# Patient Record
Sex: Female | Born: 1955 | Race: White | Hispanic: No | Smoking: Former smoker
Health system: Southern US, Community
[De-identification: ages and names within clinical notes are randomized; demographics above are authoritative.]

## PROBLEM LIST (undated history)

## (undated) DIAGNOSIS — IMO0002 Reserved for concepts with insufficient information to code with codable children: Secondary | ICD-10-CM

## (undated) DIAGNOSIS — E229 Hyperfunction of pituitary gland, unspecified: Secondary | ICD-10-CM

## (undated) DIAGNOSIS — F329 Major depressive disorder, single episode, unspecified: Secondary | ICD-10-CM

## (undated) DIAGNOSIS — F32A Depression, unspecified: Secondary | ICD-10-CM

## (undated) DIAGNOSIS — N83202 Unspecified ovarian cyst, left side: Secondary | ICD-10-CM

## (undated) DIAGNOSIS — U071 COVID-19: Secondary | ICD-10-CM

## (undated) DIAGNOSIS — R896 Abnormal cytological findings in specimens from other organs, systems and tissues: Secondary | ICD-10-CM

## (undated) HISTORY — DX: Abnormal cytological findings in specimens from other organs, systems and tissues: R89.6

## (undated) HISTORY — PX: TONSILLECTOMY AND ADENOIDECTOMY: SUR1326

## (undated) HISTORY — DX: Major depressive disorder, single episode, unspecified: F32.9

## (undated) HISTORY — PX: POLYPECTOMY: SHX149

## (undated) HISTORY — PX: NASAL SEPTUM SURGERY: SHX37

## (undated) HISTORY — DX: Hyperfunction of pituitary gland, unspecified: E22.9

## (undated) HISTORY — DX: Depression, unspecified: F32.A

## (undated) HISTORY — DX: Reserved for concepts with insufficient information to code with codable children: IMO0002

## (undated) HISTORY — DX: Unspecified ovarian cyst, left side: N83.202

---

## 2005-03-28 HISTORY — PX: FOOT SURGERY: SHX648

## 2006-03-28 DIAGNOSIS — IMO0001 Reserved for inherently not codable concepts without codable children: Secondary | ICD-10-CM

## 2006-03-28 HISTORY — DX: Reserved for inherently not codable concepts without codable children: IMO0001

## 2006-03-30 ENCOUNTER — Other Ambulatory Visit: Admission: RE | Admit: 2006-03-30 | Discharge: 2006-03-30 | Payer: Self-pay | Admitting: Gynecology

## 2007-01-24 ENCOUNTER — Other Ambulatory Visit: Admission: RE | Admit: 2007-01-24 | Discharge: 2007-01-24 | Payer: Self-pay | Admitting: Gynecology

## 2007-01-27 DIAGNOSIS — R7989 Other specified abnormal findings of blood chemistry: Secondary | ICD-10-CM

## 2007-01-27 HISTORY — DX: Other specified abnormal findings of blood chemistry: R79.89

## 2007-03-29 HISTORY — PX: ROTATOR CUFF REPAIR: SHX139

## 2007-09-20 ENCOUNTER — Ambulatory Visit (HOSPITAL_BASED_OUTPATIENT_CLINIC_OR_DEPARTMENT_OTHER): Admission: RE | Admit: 2007-09-20 | Discharge: 2007-09-21 | Payer: Self-pay | Admitting: Orthopedic Surgery

## 2009-07-26 DIAGNOSIS — IMO0002 Reserved for concepts with insufficient information to code with codable children: Secondary | ICD-10-CM

## 2009-07-26 HISTORY — DX: Reserved for concepts with insufficient information to code with codable children: IMO0002

## 2009-07-26 HISTORY — PX: COLPOSCOPY: SHX161

## 2009-08-03 ENCOUNTER — Ambulatory Visit: Payer: Self-pay | Admitting: Gynecology

## 2009-08-03 ENCOUNTER — Other Ambulatory Visit: Admission: RE | Admit: 2009-08-03 | Discharge: 2009-08-03 | Payer: Self-pay | Admitting: Gynecology

## 2009-08-13 ENCOUNTER — Ambulatory Visit: Payer: Self-pay | Admitting: Gynecology

## 2010-04-05 ENCOUNTER — Ambulatory Visit (HOSPITAL_COMMUNITY): Admit: 2010-04-05 | Payer: Self-pay | Admitting: Psychiatry

## 2010-04-13 ENCOUNTER — Encounter
Admission: RE | Admit: 2010-04-13 | Discharge: 2010-04-13 | Payer: Self-pay | Source: Home / Self Care | Attending: Pulmonary Disease | Admitting: Pulmonary Disease

## 2010-05-28 ENCOUNTER — Encounter: Payer: Self-pay | Admitting: Family Medicine

## 2010-05-28 ENCOUNTER — Ambulatory Visit (INDEPENDENT_AMBULATORY_CARE_PROVIDER_SITE_OTHER): Payer: Federal, State, Local not specified - PPO | Admitting: Family Medicine

## 2010-05-28 ENCOUNTER — Other Ambulatory Visit: Payer: Self-pay | Admitting: Family Medicine

## 2010-05-28 ENCOUNTER — Ambulatory Visit
Admission: RE | Admit: 2010-05-28 | Discharge: 2010-05-28 | Disposition: A | Discharge status: Home / Self Care | Payer: Federal, State, Local not specified - PPO | Source: Ambulatory Visit | Attending: Family Medicine | Admitting: Family Medicine

## 2010-05-28 DIAGNOSIS — F329 Major depressive disorder, single episode, unspecified: Secondary | ICD-10-CM | POA: Insufficient documentation

## 2010-05-28 DIAGNOSIS — M25519 Pain in unspecified shoulder: Secondary | ICD-10-CM

## 2010-05-28 DIAGNOSIS — M6520 Calcific tendinitis, unspecified site: Secondary | ICD-10-CM | POA: Insufficient documentation

## 2010-05-28 DIAGNOSIS — F3289 Other specified depressive episodes: Secondary | ICD-10-CM | POA: Insufficient documentation

## 2010-06-03 NOTE — Letter (Signed)
Summary: Out of Work  [facility]  [address]   [place], [place] [address]   Phone: [phone]  Fax: [phone]    May 28, 2010   Employee:  [name]    To Whom It May Concern:   For Medical reasons,  Lindsey Klein must not use her right arm/shoulder for 10 days (through 06/06/10)    If you need additional information, please feel free to contact our office.         Sincerely,    Donna Christen MD

## 2010-06-08 NOTE — Assessment & Plan Note (Signed)
Summary: PULLED MUSCLE/WB rm 3   Vital Signs:  Patient Profile:   55 Years Old Female CC:      RT shoulder pain Height:     66 inches Weight:      192.25 pounds O2 Sat:      96 % O2 treatment:    Room Air Temp:     98.5 degrees F oral Pulse rate:   84 / minute Resp:     16 per minute BP sitting:   153 / 81  (left arm) Cuff size:   regular  Pt. in pain?   yes    Location:   shoulder    Intensity:   9    Type:       sharp  Vitals Entered By: Clemens Catholic LPN (May 27, 452 9:04 AM)                   Updated Prior Medication List: LEXAPRO 20 MG TABS (ESCITALOPRAM OXALATE)  WELLBUTRIN XL 300 MG XR24H-TAB (BUPROPION HCL)  LAMICTAL 100 MG TABS (LAMOTRIGINE)  ADVAIR DISKUS 100-50 MCG/DOSE AEPB (FLUTICASONE-SALMETEROL)   Current Allergies: ! PENICILLINHistory of Present Illness Chief Complaint: RT shoulder pain History of Present Illness:  Subjective:  Patient works as a Doctor, hospital.  Last night she developed soreness in her right shoulder that has persisted, although she recalls no distinct trauma.  REVIEW OF SYSTEMS Constitutional Symptoms      Denies fever, chills, night sweats, weight loss, weight gain, and fatigue.  Eyes       Denies change in vision, eye pain, eye discharge, glasses, contact lenses, and eye surgery. Ear/Nose/Throat/Mouth       Denies hearing loss/aids, change in hearing, ear pain, ear discharge, dizziness, frequent runny nose, frequent nose bleeds, sinus problems, sore throat, hoarseness, and tooth pain or bleeding.  Respiratory       Denies dry cough, productive cough, wheezing, shortness of breath, asthma, bronchitis, and emphysema/COPD.  Cardiovascular       Denies murmurs, chest pain, and tires easily with exhertion.    Gastrointestinal       Denies stomach pain, nausea/vomiting, diarrhea, constipation, blood in bowel movements, and indigestion. Genitourniary       Denies painful urination, kidney stones, and loss of urinary  control. Neurological       Denies paralysis, seizures, and fainting/blackouts. Musculoskeletal       Complains of muscle pain and joint pain.      Denies joint stiffness, decreased range of motion, redness, swelling, muscle weakness, and gout.  Skin       Denies bruising, unusual mles/lumps or sores, and hair/skin or nail changes.  Psych       Denies mood changes, temper/anger issues, anxiety/stress, speech problems, depression, and sleep problems. Other Comments: pt c/o RT shoulder/ deltoid muscle pain x last night. no injury, no OTC meds. she states it hurts worse with movement.   Past History:  Past Medical History: Depression  Past Surgical History: Caesarean section Rotator cuff repair Tonsillectomy  Family History: mother breast father lung  Social History: Never Smoked Alcohol use-yes 2-3 drinks per wk Drug use-no Smoking Status:  never Drug Use:  no   Objective:  No acute distress  Right shoulder:  Has difficulty abducting above horizontal.  Decreased external rotation.  Has difficulty reaching behind to touch bra strap.  Tenderness over distal acromion bursa.  Distal neurovascular intact  X-ray right shoulder:   IMPRESSION: AC joint osteoarthritis changes.  Calcification adjacent  greater tuberosity may reflect calcific tendonitis and / or calcific bursitis.  No fracture or bony destruction. Assessment New Problems: CALCIUM DEPOSITS IN TENDON AND BURSA (ICD-727.82) SHOULDER PAIN, RIGHT (ICD-719.41) DEPRESSION (ICD-311)  CALCIFIC DEPOSITS SUGGEST LONG-STANDING INFLAMMATORY PROCESS  Plan New Medications/Changes: LORTAB 5 5-500 MG TABS (HYDROCODONE-ACETAMINOPHEN) One-half to one tab by mouth hs as needed pain  #10 (ten) x 0, 05/28/2010, Donna Christen MD NAPROXEN 500 MG TABS (NAPROXEN) One by mouth two times a day pc  #20 x 1, 05/28/2010, Donna Christen MD  New Orders: T-DG Shoulder*R* [73030] Sports Medicine [Sports Med] New Patient Level III  [phone] Planning Comments:   Begin Naproxen.  Analgesic at bedtime.  Begin ice packs.  Begin range of motion exercises ([facility] information and instruction patient handout given)  Arrange evaluation by sports med clinic.   The patient and/or caregiver has been counseled thoroughly with regard to medications prescribed including dosage, schedule, interactions, rationale for use, and possible side effects and they verbalize understanding.  Diagnoses and expected course of recovery discussed and will return if not improved as expected or if the condition worsens. Patient and/or caregiver verbalized understanding.  Prescriptions: LORTAB 5 5-500 MG TABS (HYDROCODONE-ACETAMINOPHEN) One-half to one tab by mouth hs as needed pain  #10 (ten) x 0   Entered and Authorized by:   Donna Christen MD   Signed by:   Donna Christen MD on 05/28/2010   Method used:   Print then Give to Patient   RxID:   [id number] NAPROXEN 500 MG TABS (NAPROXEN) One by mouth two times a day pc  #20 x 1   Entered and Authorized by:   Donna Christen MD   Signed by:   Donna Christen MD on 05/28/2010   Method used:   Print then Give to Patient   RxID:   [id number]   Orders Added: 1)  T-DG Shoulder*R* [73030] 2)  Sports Medicine [Sports Med] 3)  New Patient Level III [13244]

## 2010-06-30 ENCOUNTER — Ambulatory Visit (INDEPENDENT_AMBULATORY_CARE_PROVIDER_SITE_OTHER): Payer: Federal, State, Local not specified - PPO | Admitting: Psychiatry

## 2010-06-30 ENCOUNTER — Ambulatory Visit (HOSPITAL_COMMUNITY): Payer: Self-pay | Admitting: Psychiatry

## 2010-06-30 DIAGNOSIS — F339 Major depressive disorder, recurrent, unspecified: Secondary | ICD-10-CM

## 2010-08-10 NOTE — Op Note (Signed)
NAMEANALEESE, Lindsey Klein                ACCOUNT NO.:  [id number]   MEDICAL RECORD NO.:  [id number]          PATIENT TYPE:  AMB   LOCATION:  DSC                          FACILITY:  [facility]   PHYSICIAN:  Loreta Ave, M.D. DATE OF BIRTH:  1955/04/05   DATE OF PROCEDURE:  09/20/2007  DATE OF DISCHARGE:                               OPERATIVE REPORT   PREOPERATIVE DIAGNOSIS:  Left shoulder impingement, partial cuff tear,  distal clavicle osteolysis.   POSTOPERATIVE DIAGNOSIS:  Left shoulder impingement, partial cuff tear,  distal clavicle osteolysis, but with progression to a complete tear  supraspinatus tendon.   PROCEDURE:  Left shoulder exam under anesthesia, arthroscopy with  debridement of rotator cuff.  Acromioplasty, bursectomy, CA ligament  release.  Excision of distal clavicle.  Arthroscopic assisted rotator  cuff repair, fiber weave suture x2, swivel lock anchors x2.   SURGEON:  Loreta Ave, MD   ASSISTANT:  Genene Churn. Barry Dienes, [place], present throughout the entire case  necessary for timely completion of procedure.   ANESTHESIA:  General   BLOOD LOSS:  Minimal.   SPECIMENS:  None.   CULTURES:  None.   COMPLICATIONS:  None.   DRESSING:  Soft compressor with shoulder immobilizer.   PROCEDURE:  The patient was brought to the operating room, placed on  operating table in supine position.  After adequate anesthesia had been  obtained, shoulder was examined.  Full motion stable shoulder.  Placed  in beach-chair position in a shoulder positioner, prepped and draped in  usual sterile fashion.  Three portals created anterior, posterior, and  lateral.  Shoulder entered with blunt obturator, arthroscope introduced,  shoulder distended and inspected.  Articular cartilage capsular  ligamentous structures labrum, biceps tendon, all intact.  Unfortunately, progression to a full-thickness tear supraspinatus tendon  entire crescent region.  Debris back to healthy tissue.  Still very  mobile.  The tuberosity roughened to bleeding bone.  Relatively  osteopenic of the tuberosity.  Cannula redirected subacromially.  Acromioplasty from type 2 to a type 1 acromion with shaver high-speed  burr releasing CA ligament cautery.  Distal clavicle grade 3 changes.  Lateral centimeter of clavicle and periarticular spurs resected.  Adequacy of decompression confirmed viewing from all portals.  Lateral  portal was then opened up to a large incision cannula and inserted for  repair.  Cuff mobilized.  Captured with two weave horizontal mattress  sutures with fiber weave suture utilizing a scorpion device.  These  brought out sequentially laterally.  Both were attached with swivel lock  device.  They were then firmly anchored into attachment points adjacent  to the crest of the anterior and posterior aspects.  The first 2 anchors  were tried to put in just right through the bone.  I could not get good  capturing and holding.  I moved the attachment points out a little bit  more laterally towards the tuberosity and at the end I was able to get 2  firm attachments with the swivel lock device pulling the suture down in  place for a nice firm appropriately tension watertight closure  of the  cuff.  Excess suture was cut off.  Repair assessed and found be good  throughout.  Instruments and fluid removed.  Portals all closed with  nylon.  Sterile compressive dressing applied.  Anesthesia reversed.  Brought to recovery room.  Tolerated the surgery well.  No  complications.      Loreta Ave, M.D.  Electronically Signed     DFM/MEDQ  D:  09/20/2007  T:  09/21/2007  Job:  [id number]

## 2010-08-30 ENCOUNTER — Encounter (HOSPITAL_COMMUNITY): Payer: Federal, State, Local not specified - PPO | Admitting: Psychiatry

## 2010-08-31 ENCOUNTER — Encounter (INDEPENDENT_AMBULATORY_CARE_PROVIDER_SITE_OTHER): Payer: Federal, State, Local not specified - PPO | Admitting: Psychiatry

## 2010-08-31 DIAGNOSIS — F339 Major depressive disorder, recurrent, unspecified: Secondary | ICD-10-CM

## 2010-11-02 ENCOUNTER — Encounter (HOSPITAL_COMMUNITY): Payer: Federal, State, Local not specified - PPO | Admitting: Psychiatry

## 2010-12-08 ENCOUNTER — Encounter (INDEPENDENT_AMBULATORY_CARE_PROVIDER_SITE_OTHER): Payer: Federal, State, Local not specified - PPO | Admitting: Psychiatry

## 2010-12-08 DIAGNOSIS — F339 Major depressive disorder, recurrent, unspecified: Secondary | ICD-10-CM

## 2010-12-23 LAB — POCT HEMOGLOBIN-HEMACUE: Hemoglobin: 13.2

## 2011-01-14 DIAGNOSIS — F329 Major depressive disorder, single episode, unspecified: Secondary | ICD-10-CM | POA: Insufficient documentation

## 2011-01-14 DIAGNOSIS — IMO0001 Reserved for inherently not codable concepts without codable children: Secondary | ICD-10-CM | POA: Insufficient documentation

## 2011-01-14 DIAGNOSIS — R7989 Other specified abnormal findings of blood chemistry: Secondary | ICD-10-CM | POA: Insufficient documentation

## 2011-01-14 DIAGNOSIS — IMO0002 Reserved for concepts with insufficient information to code with codable children: Secondary | ICD-10-CM | POA: Insufficient documentation

## 2011-01-19 ENCOUNTER — Other Ambulatory Visit (HOSPITAL_COMMUNITY)
Admission: RE | Admit: 2011-01-19 | Discharge: 2011-01-19 | Disposition: A | Discharge status: Home / Self Care | Payer: Federal, State, Local not specified - PPO | Source: Ambulatory Visit | Attending: Gynecology | Admitting: Gynecology

## 2011-01-19 ENCOUNTER — Encounter: Payer: Self-pay | Admitting: Gynecology

## 2011-01-19 ENCOUNTER — Ambulatory Visit (INDEPENDENT_AMBULATORY_CARE_PROVIDER_SITE_OTHER): Payer: Federal, State, Local not specified - PPO | Admitting: Gynecology

## 2011-01-19 VITALS — BP 128/76 | Ht 65.0 in | Wt 178.0 lb

## 2011-01-19 DIAGNOSIS — E559 Vitamin D deficiency, unspecified: Secondary | ICD-10-CM

## 2011-01-19 DIAGNOSIS — Z131 Encounter for screening for diabetes mellitus: Secondary | ICD-10-CM

## 2011-01-19 DIAGNOSIS — Z01419 Encounter for gynecological examination (general) (routine) without abnormal findings: Secondary | ICD-10-CM

## 2011-01-19 DIAGNOSIS — Z13220 Encounter for screening for lipoid disorders: Secondary | ICD-10-CM

## 2011-01-19 DIAGNOSIS — E782 Mixed hyperlipidemia: Secondary | ICD-10-CM

## 2011-01-19 NOTE — Patient Instructions (Signed)
Return in 6 months for pap smear.

## 2011-01-19 NOTE — Progress Notes (Signed)
Lindsey Klein Oct 07, 1955 [id number]        55 y.o.  for annual exam.  History of low-grade SIL Pap May 2011 with inadequate colposcopy and a negative ECC. She was to follow up in 6 months for Pap smear but never did that. She also has a history of marginal prolactin elevation in the past her most recent check last year was at 52.  Menses are becoming irregular and she is having some hot flushes and night sweats.  Past medical history,surgical history, medications, allergies, family history and social history were all reviewed and documented in the EPIC chart. ROS:  Was performed and pertinent positives and negatives are included in the history.  Exam: chaperone present Filed Vitals:   01/19/11 1449  BP: 128/76   General appearance  Normal Skin grossly normal Head/Neck normal with no cervical or supraclavicular adenopathy thyroid normal Lungs  clear Cardiac RR, without RMG Abdominal  soft, nontender, without masses, organomegaly or hernia Breasts  examined lying and sitting without masses, retractions, discharge or axillary adenopathy. Pelvic  Ext/BUS/vagina  normal   Cervix  normal  Pap done  Uterus  anteverted, normal size, shape and contour, midline and mobile nontender   Adnexa  Without masses or tenderness    Anus and perineum  normal   Rectovaginal  normal sphincter tone without palpated masses or tenderness.    Assessment/Plan:  55 y.o. female for annual exam.    1. Low-grade SIL Pap. Patient has history of ascus negative high risk HPV in 08 subsequently had low-grade SIL Pap smear May 2011. Her colposcopy was negative but inadequate an ECC was performed and was negative. She failed to follow up in 6 months for repeat Pap as instructed. Pap was done today she knows regardless she needs to follow up in 6 months for rePap.  We need to get several normal Paps every 6 months before returning to annual screening. Again the issue of progression to a more serious lesion was  reviewed. 2. Menopausal symptoms. Patient is in the perimenopause with last menstrual period March 2012. She is having some hot flashes and sweats. I reviewed options with her to include the expected management, OTC soy and black cohosh, nonhormonal pharmacologic such as Effexor and HRT. I discussed risks benefits, WHI study, increased risks stroke heart attack DVT increased risk of breast cancer and the ACOG and NAMS statements of lowest dose for shortest period of time. Patient is on antidepressants she can try OTC soy based and then we'll go from there. If her symptoms become unacceptable she will represent to rediscuss HRT. She'll keep a menstrual calendar as long as less frequent but normal menses we'll follow if prolonged or atypical bleeding she'll call or represent for evaluation. 3. Elevated prolactin. Patient has history of low level elevation in the past at 37.  Recheck was 28 and her last year value was 16. I think we can stop following this at this time and if she develops any issues such as galactorrhea she Will report this. 4. Health maintenance. SBE monthly reviewed.  Had normal mammogram January 2012 I reminded her to schedule it this coming January. We'll plan bone density after she is clearly menopausal. Never had a colonoscopy and I recommended that she schedule this. Baseline blood work to include CBC glucose lipid profile urinalysis and vitamin D was ordered. Assuming she continues well she will see [place] in 6 months for her Pap.  Dara Lords MD, 3:33 PM 01/19/2011

## 2011-01-20 LAB — VITAMIN D 25 HYDROXY (VIT D DEFICIENCY, FRACTURES): Vit D, 25-Hydroxy: 22 ng/mL — ABNORMAL LOW (ref 30–89)

## 2011-01-20 NOTE — Progress Notes (Signed)
Addended by: Richardson Chiquito on: 01/20/2011 04:15 PM   Modules accepted: Orders

## 2011-03-04 ENCOUNTER — Encounter (HOSPITAL_COMMUNITY): Payer: Self-pay

## 2011-03-08 ENCOUNTER — Encounter (HOSPITAL_COMMUNITY): Payer: Self-pay | Admitting: Psychiatry

## 2011-03-08 ENCOUNTER — Ambulatory Visit (INDEPENDENT_AMBULATORY_CARE_PROVIDER_SITE_OTHER): Payer: Federal, State, Local not specified - PPO | Admitting: Psychiatry

## 2011-03-08 VITALS — BP 130/75 | Ht 67.0 in | Wt 176.0 lb

## 2011-03-08 DIAGNOSIS — F329 Major depressive disorder, single episode, unspecified: Secondary | ICD-10-CM

## 2011-03-08 NOTE — Progress Notes (Signed)
   [facility] Behavioral Health Follow-up Outpatient Visit  Lindsey Klein Jun 17, 1955   Subjective: The patient is a 55 year old female who has been followed by [facility] since April 2012. 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 Patient continues to lose weight. At today's visit she is down an additional 6 pounds. She's got her hair cut and styled. She reports that her probation for her DUI will be up in January. It will give her more freedom to drive. The patient endorses strange sleep cycle. However she does work nights. She reports today that she plans to retire from the [employer] in 2015. The patient has been communicating with old flame in [place]. He and she are going to [place] for a vacation in January. She reports that he wants to marry her. The patient feels like she is doing well. No complaints today.  Filed Vitals:   03/08/11 1454  BP: 130/75    Mental Status Examination  Appearance: Casually dressed Alert: Yes Attention: good  Cooperative: Yes Eye Contact: Good Speech: Regular rate rhythm and volume Psychomotor Activity: Normal Memory/Concentration: Intact Oriented: person, place, time/date and situation Mood: Euthymic Affect: Full Range Thought Processes and Associations: Logical Fund of Knowledge: Fair Thought Content: No suicidal or homicidal thoughts Insight: Fair Judgement: Fair  Diagnosis: Maj. depressive disorder, recurrent, moderate  Treatment Plan: At this point we'll not make any changes. Patient was given nine-month supply of medication in September. I will see the patient back in 3 months.  Jamse Mead, MD

## 2011-06-08 ENCOUNTER — Ambulatory Visit (INDEPENDENT_AMBULATORY_CARE_PROVIDER_SITE_OTHER): Payer: Federal, State, Local not specified - PPO | Admitting: Psychiatry

## 2011-06-08 ENCOUNTER — Encounter (HOSPITAL_COMMUNITY): Payer: Self-pay | Admitting: Psychiatry

## 2011-06-08 VITALS — BP 128/82 | Ht 67.0 in | Wt 184.0 lb

## 2011-06-08 DIAGNOSIS — F329 Major depressive disorder, single episode, unspecified: Secondary | ICD-10-CM

## 2011-06-08 NOTE — Progress Notes (Signed)
   [facility] Behavioral Health Follow-up Outpatient Visit  Lindsey Klein May 28, 1955   Subjective: The patient is a 56 year old female who has been followed by [facility] since April 2012. 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 Patient is currently treated with a combination of Wellbutrin XL, Lexapro, and Lamictal. She continues to do well. She is off probation now for DUI. This has made a big difference with her life. She ended things with the gentleman who she was in January. She felt that he was to trenchant further. Patient states that she has no tolerance for BS. Patient is now seeing an allergist he thinks that she might have allergies that are keeping herself from sleeping. It she may be having a sleep study. She is up today 8 pounds. She states that she has been eating and healthy. She still has episodes of low motivation, and feels that her mood is stable.  Filed Vitals:   06/08/11 1525  BP: 128/82    Mental Status Examination  Appearance: Casually dressed Alert: Yes Attention: good  Cooperative: Yes Eye Contact: Good Speech: Regular rate rhythm and volume Psychomotor Activity: Normal Memory/Concentration: Intact Oriented: person, place, time/date and situation Mood: Euthymic Affect: Full Range Thought Processes and Associations: Logical Fund of Knowledge: Fair Thought Content: No suicidal or homicidal thoughts Insight: Fair Judgement: Fair  Diagnosis: Maj. depressive disorder, recurrent, moderate  Treatment Plan: At this point we'll not make any changes.  will see the patient back in 3 months.  Jamse Mead, MD

## 2011-09-06 ENCOUNTER — Encounter (HOSPITAL_COMMUNITY): Payer: Self-pay | Admitting: Psychiatry

## 2011-09-06 ENCOUNTER — Ambulatory Visit (INDEPENDENT_AMBULATORY_CARE_PROVIDER_SITE_OTHER): Payer: Federal, State, Local not specified - PPO | Admitting: Psychiatry

## 2011-09-06 VITALS — BP 122/78 | Ht 67.0 in | Wt 186.0 lb

## 2011-09-06 DIAGNOSIS — F329 Major depressive disorder, single episode, unspecified: Secondary | ICD-10-CM

## 2011-09-06 NOTE — Progress Notes (Signed)
   [facility] Behavioral Health Follow-up Outpatient Visit  WINNI EHRHARD 20-Jan-1956   Subjective: The patient is a 56 year old female who has been followed by [facility] since April 2012. 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 Patient is currently treated with a combination of Wellbutrin XL, Lexapro, and Lamictal. She presents today after just waking up. She continues to work nights. She did a different job last night it was much more physical, and she is worn out today. She has switched her days off. Now instead of being off Tuesday and Wednesday night, she is off Wednesday and Thursday night. She is hoping that this will enable her to be more social. She endorses good sleep and appetite. Her mood is stable. She went to see her brother in [place] a few weeks ago. The visit went well. She has no concerns today.  Filed Vitals:   09/06/11 1542  BP: 122/78    Mental Status Examination  Appearance: Casually dressed Alert: Yes Attention: good  Cooperative: Yes Eye Contact: Good Speech: Regular rate rhythm and volume Psychomotor Activity: Normal Memory/Concentration: Intact Oriented: person, place, time/date and situation Mood: Euthymic Affect: Full Range Thought Processes and Associations: Logical Fund of Knowledge: Fair Thought Content: No suicidal or homicidal thoughts Insight: Fair Judgement: Fair  Diagnosis: Maj. depressive disorder, recurrent, moderate  Treatment Plan: At this point we'll not make any changes. I will see the patient back in 3 months.  Jamse Mead, MD

## 2011-11-22 ENCOUNTER — Other Ambulatory Visit (HOSPITAL_COMMUNITY): Payer: Self-pay | Admitting: Psychiatry

## 2011-11-22 MED ORDER — BUPROPION HCL ER (XL) 300 MG PO TB24: 300.0000 mg | ORAL_TABLET | Freq: Every day | ORAL | Status: DC

## 2011-11-22 MED ORDER — ESCITALOPRAM OXALATE 20 MG PO TABS: 20.0000 mg | ORAL_TABLET | Freq: Every day | ORAL | Status: DC

## 2011-11-22 MED ORDER — LAMOTRIGINE 100 MG PO TABS: 100.0000 mg | ORAL_TABLET | Freq: Every day | ORAL | Status: DC

## 2011-11-30 ENCOUNTER — Telehealth (HOSPITAL_COMMUNITY): Payer: Self-pay

## 2011-11-30 MED ORDER — LAMOTRIGINE 100 MG PO TABS: 100.0000 mg | ORAL_TABLET | Freq: Every day | ORAL | Status: DC

## 2011-11-30 MED ORDER — BUPROPION HCL ER (XL) 300 MG PO TB24: 300.0000 mg | ORAL_TABLET | Freq: Every day | ORAL | Status: DC

## 2011-11-30 NOTE — Telephone Encounter (Signed)
Wellbutrin and lamictal 90day supply faxed [facility]

## 2011-12-07 ENCOUNTER — Ambulatory Visit (INDEPENDENT_AMBULATORY_CARE_PROVIDER_SITE_OTHER): Payer: Federal, State, Local not specified - PPO | Admitting: Psychiatry

## 2011-12-07 ENCOUNTER — Encounter (HOSPITAL_COMMUNITY): Payer: Self-pay | Admitting: Psychiatry

## 2011-12-07 VITALS — BP 118/72 | Ht 67.0 in | Wt 184.0 lb

## 2011-12-07 DIAGNOSIS — F329 Major depressive disorder, single episode, unspecified: Secondary | ICD-10-CM

## 2011-12-07 DIAGNOSIS — F331 Major depressive disorder, recurrent, moderate: Secondary | ICD-10-CM

## 2011-12-07 NOTE — Progress Notes (Signed)
   [facility] Behavioral Health Follow-up Outpatient Visit  Lindsey Klein 05/19/1955   Subjective: The patient is a 56 year old female who has been followed by [facility] since April 2012. 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 Patient is currently treated with a combination of Wellbutrin XL, Lexapro, and Lamictal. She is asking today about possibly switching to Celexa. Her daughter is on it. She is wondering if it would make any difference and that the Lexapro wasn't working as well. The patient endorses good sleep and appetite. She is upset because a lot of her clothes do not fit.  This sometimes keeps her from going out. She does try to go out with friends on Thursday evenings. She is denying any anxiety today. She feels that she is holding it together. She most recently got her Lexapro filled last week, so she doesn't see the need to try switching to Celexa today.  Filed Vitals:   12/07/11 1403  BP: 118/72    Mental Status Examination  Appearance: Casually dressed Alert: Yes Attention: good  Cooperative: Yes Eye Contact: Good Speech: Regular rate rhythm and volume Psychomotor Activity: Normal Memory/Concentration: Intact Oriented: person, place, time/date and situation Mood: Euthymic Affect: Full Range Thought Processes and Associations: Logical Fund of Knowledge: Fair Thought Content: No suicidal or homicidal thoughts Insight: Fair Judgement: Fair  Diagnosis: Maj. depressive disorder, recurrent, moderate  Treatment Plan: At this point we'll not make any changes. I will see the patient back in 3 months.  Jamse Mead, MD

## 2012-03-07 ENCOUNTER — Ambulatory Visit (HOSPITAL_COMMUNITY): Payer: Self-pay | Admitting: Psychiatry

## 2012-03-13 ENCOUNTER — Ambulatory Visit (HOSPITAL_COMMUNITY): Payer: Self-pay | Admitting: Psychiatry

## 2012-03-28 DIAGNOSIS — N83202 Unspecified ovarian cyst, left side: Secondary | ICD-10-CM

## 2012-03-28 HISTORY — DX: Unspecified ovarian cyst, left side: N83.202

## 2012-04-12 ENCOUNTER — Ambulatory Visit (INDEPENDENT_AMBULATORY_CARE_PROVIDER_SITE_OTHER): Payer: Federal, State, Local not specified - PPO | Admitting: Psychiatry

## 2012-04-12 ENCOUNTER — Encounter (HOSPITAL_COMMUNITY): Payer: Self-pay | Admitting: Psychiatry

## 2012-04-12 VITALS — BP 122/82 | Ht 67.0 in | Wt 178.0 lb

## 2012-04-12 DIAGNOSIS — F331 Major depressive disorder, recurrent, moderate: Secondary | ICD-10-CM

## 2012-04-12 NOTE — Progress Notes (Signed)
   [facility] Behavioral Health Follow-up Outpatient Visit  Lindsey Klein Aug 21, 1955   Subjective: The patient is a 57 year old female who has been followed by [facility] since April 2012. 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 Patient is currently treated with a combination of Wellbutrin XL, Lexapro, and Lamictal. At her last appointment, I did not make any changes. The patient presents today. She said something she felt was inappropriate to Herbalist while checking in today. She is very upset about this. She feels that she has no filter recently. She is not getting quality sleep. She has been sleeping on the couch during the day with the TV on and no light cultures. Patient was out of work for a week and a half. She has 2 degenerative discs. She is starting physical therapy. She is back at work, but is on light TV. 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 The patient is currently not dating. She endorses good sleep and appetite. She is down 6 pounds today.  Filed Vitals:   04/12/12 1304  BP: 122/82    Mental Status Examination  Appearance: Casually dressed Alert: Yes Attention: good  Cooperative: Yes Eye Contact: Good Speech: Regular rate rhythm and volume Psychomotor Activity: Normal Memory/Concentration: Intact Oriented: person, place, time/date and situation Mood: Euthymic Affect: Full Range Thought Processes and Associations: Logical Fund of Knowledge: Fair Thought Content: No suicidal or homicidal thoughts Insight: Fair Judgement: Fair  Diagnosis: Maj. depressive disorder, recurrent, moderate  Treatment Plan: I will increase Lamictal to 150 mg daily. I will continue her Wellbutrin XL, and Lexapro. Patient has quantity sufficient at home to increase the Lamictal. I will see her back in 6 weeks. Patient may call with concerns. Jamse Mead, MD

## 2012-05-24 ENCOUNTER — Ambulatory Visit (HOSPITAL_COMMUNITY): Payer: Self-pay | Admitting: Psychiatry

## 2012-06-07 ENCOUNTER — Ambulatory Visit (INDEPENDENT_AMBULATORY_CARE_PROVIDER_SITE_OTHER): Payer: Federal, State, Local not specified - PPO | Admitting: Psychiatry

## 2012-06-07 ENCOUNTER — Encounter (HOSPITAL_COMMUNITY): Payer: Self-pay | Admitting: Psychiatry

## 2012-06-07 VITALS — BP 124/82 | Ht 67.0 in | Wt 181.0 lb

## 2012-06-07 DIAGNOSIS — F331 Major depressive disorder, recurrent, moderate: Secondary | ICD-10-CM

## 2012-06-07 MED ORDER — LAMOTRIGINE 150 MG PO TABS: 150.0000 mg | ORAL_TABLET | Freq: Every day | ORAL | Status: DC

## 2012-06-07 NOTE — Progress Notes (Signed)
   [facility] Behavioral Health Follow-up Outpatient Visit  Lindsey Klein December 16, 1955   Subjective: The patient is a 57 year old female who has been followed by [facility] since April 2012. 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 Patient is currently treated with a combination of Wellbutrin XL, Lexapro, and Lamictal. At her last appointment, I increase Lamictal up to 150 mg daily secondary to mood lability. The patient presents today. She never increased the Lamictal. 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 She sleeps a lot at home. She feels that she's in a funk. She's wondering if her vitamin D is low. She does not have a primary care physician. She is back on full duty. Work is going well. She does have a hard time with the time change since she does shift work. She has been bad about not eating and then binging. She is up 3 pounds today.  Filed Vitals:   06/07/12 1040  BP: 124/82    Mental Status Examination  Appearance: Casually dressed Alert: Yes Attention: good  Cooperative: Yes Eye Contact: Good Speech: Regular rate rhythm and volume Psychomotor Activity: Normal Memory/Concentration: Intact Oriented: person, place, time/date and situation Mood: Euthymic Affect: Full Range Thought Processes and Associations: Logical Fund of Knowledge: Fair Thought Content: No suicidal or homicidal thoughts Insight: Fair Judgement: Fair  Diagnosis: Maj. depressive disorder, recurrent, moderate  Treatment Plan: I will increase Lamictal to 150 mg daily. I will continue her Wellbutrin XL, and Lexapro. I have provided new prescription for Lamictal so she has no choice but to increase it. I will see her back in 6 weeks. Patient may call with concerns. I have suggested she go upstairs to make an appointment on her way out. Jamse Mead MD

## 2012-06-21 ENCOUNTER — Ambulatory Visit: Payer: Self-pay | Admitting: Family Medicine

## 2012-07-11 ENCOUNTER — Telehealth (HOSPITAL_COMMUNITY): Payer: Self-pay

## 2012-07-11 MED ORDER — ALPRAZOLAM 0.5 MG PO TABS: 0.5000 mg | ORAL_TABLET | Freq: Every evening | ORAL | Status: DC | PRN

## 2012-07-11 NOTE — Telephone Encounter (Signed)
ok 

## 2012-07-11 NOTE — Telephone Encounter (Signed)
Pt was wondering if you could call her in #2 xanax she is going to fly this weekend and is very anxious/scared about it.

## 2012-07-26 ENCOUNTER — Ambulatory Visit (HOSPITAL_COMMUNITY): Payer: Self-pay | Admitting: Psychiatry

## 2012-08-16 ENCOUNTER — Ambulatory Visit (HOSPITAL_COMMUNITY): Payer: Self-pay | Admitting: Psychiatry

## 2012-11-12 ENCOUNTER — Ambulatory Visit (INDEPENDENT_AMBULATORY_CARE_PROVIDER_SITE_OTHER): Payer: Federal, State, Local not specified - PPO | Admitting: Psychiatry

## 2012-11-12 ENCOUNTER — Encounter (HOSPITAL_COMMUNITY): Payer: Self-pay | Admitting: Psychiatry

## 2012-11-12 VITALS — BP 122/80 | Ht 67.0 in | Wt 163.0 lb

## 2012-11-12 DIAGNOSIS — F331 Major depressive disorder, recurrent, moderate: Secondary | ICD-10-CM

## 2012-11-12 MED ORDER — ESCITALOPRAM OXALATE 20 MG PO TABS: 20.0000 mg | ORAL_TABLET | Freq: Every day | ORAL | Status: DC

## 2012-11-12 MED ORDER — LAMOTRIGINE 150 MG PO TABS: 150.0000 mg | ORAL_TABLET | Freq: Every day | ORAL | Status: DC

## 2012-11-12 MED ORDER — BUPROPION HCL ER (XL) 300 MG PO TB24: 300.0000 mg | ORAL_TABLET | Freq: Every day | ORAL | Status: DC

## 2012-11-12 NOTE — Progress Notes (Signed)
   [facility] Behavioral Health Follow-up Outpatient Visit  Lindsey Klein July 15, 1955   Subjective: The patient is a 57 year old female who has been followed by [facility] since April 2012. 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 Patient is currently treated with a combination of Wellbutrin XL, Lexapro, and Lamictal. At her last appointment, I increase Lamictal up to 150 mg daily secondary to mood lability. The patient presents today. She just returned a weeklong trip to [place]. She had an excellent time. She had dropped 25 pounds prior to the trip, and is down 18 pounds total today. The patient continues to be fatigued all the time. She slept in the same bed as her sister. Her sister stated that she kicked all night. The patient has been treated in the past for restless leg syndrome, but had issues coming off the old medication. The patient is still dragging. 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 She is concerned about her daughter is having marital issues. She did date for a month, but he cared more about work than her. The patient denies any depression or anxiety. She feels the increase in Lamictal is helping.  Filed Vitals:   11/12/12 1314  BP: 122/80    Mental Status Examination  Appearance: Casually dressed Alert: Yes Attention: good  Cooperative: Yes Eye Contact: Good Speech: Regular rate rhythm and volume Psychomotor Activity: Normal Memory/Concentration: Intact Oriented: person, place, time/date and situation Mood: Euthymic Affect: Full Range Thought Processes and Associations: Logical Fund of Knowledge: Fair Thought Content: No suicidal or homicidal thoughts Insight: Fair Judgement: Fair  Diagnosis: Maj. depressive disorder, recurrent, moderate  Treatment Plan: I will not make any changes today. I will continue the Lamictal at 150 mg daily, Lexapro 20 mg daily, and the Wellbutrin XL 300 mg a  day. I will see her back in 3 months. I like her to obtain a primary care physician. [facility] would like [facility]. I believe that she probably needs to see a neurologist regarding potential restless leg syndrome. This may call with concerns.     Jamse Mead MD

## 2013-01-24 ENCOUNTER — Other Ambulatory Visit (HOSPITAL_COMMUNITY): Payer: Self-pay | Admitting: Psychiatry

## 2013-01-29 ENCOUNTER — Other Ambulatory Visit (HOSPITAL_COMMUNITY): Payer: Self-pay | Admitting: Psychiatry

## 2013-02-04 NOTE — Telephone Encounter (Signed)
Fill Wellbutrin for 30 days. Will see patient on 02/14/2013.

## 2013-02-14 ENCOUNTER — Ambulatory Visit (INDEPENDENT_AMBULATORY_CARE_PROVIDER_SITE_OTHER): Payer: Federal, State, Local not specified - PPO | Admitting: Psychiatry

## 2013-02-14 ENCOUNTER — Ambulatory Visit (HOSPITAL_COMMUNITY): Payer: Self-pay | Admitting: Psychiatry

## 2013-02-14 ENCOUNTER — Encounter (INDEPENDENT_AMBULATORY_CARE_PROVIDER_SITE_OTHER): Payer: Self-pay

## 2013-02-14 ENCOUNTER — Encounter (HOSPITAL_COMMUNITY): Payer: Self-pay | Admitting: Psychiatry

## 2013-02-14 VITALS — BP 124/73 | HR 68 | Ht 67.0 in | Wt 170.0 lb

## 2013-02-14 DIAGNOSIS — F332 Major depressive disorder, recurrent severe without psychotic features: Secondary | ICD-10-CM

## 2013-02-14 DIAGNOSIS — F331 Major depressive disorder, recurrent, moderate: Secondary | ICD-10-CM

## 2013-02-14 MED ORDER — BUPROPION HCL ER (XL) 300 MG PO TB24: 300.0000 mg | ORAL_TABLET | Freq: Every day | ORAL | Status: DC

## 2013-02-14 MED ORDER — ESCITALOPRAM OXALATE 20 MG PO TABS: 20.0000 mg | ORAL_TABLET | Freq: Every day | ORAL | Status: DC

## 2013-02-14 MED ORDER — LAMOTRIGINE 150 MG PO TABS: 150.0000 mg | ORAL_TABLET | Freq: Every day | ORAL | Status: DC

## 2013-02-14 NOTE — Progress Notes (Signed)
[facility] Behavioral Health [address] Progress Note  Lindsey Klein [id number] 57 y.o.  02/14/2013 1:01 PM  Chief Complaint: Follow up  History of Present Illness: HPI Comments: Ms. Bazar is  a 57 y/o female with a past psychiatric history significant for Major Depressive Disorder, recurrent, severe without psychotic features. The patient is referred for psychiatric services for psychiatric evaluation and medication management.    . Location: Patient reports she has been doing well.  . Quality: The patient reports that her main stressors are:  "cats." Fighting with each other.  In the area of affective symptoms, patient appears euthymic. Patient denies current suicidal ideation, intent, or plan. Patient denies current homicidal ideation, intent, or plan. Patient denies auditory hallucinations. Patient denies visual hallucinations. Patient denies symptoms of paranoia. Patient states sleep is poor, despite approximately 7-8 hours of sleep per night. Appetite is good. Energy level is poor. Patient endorses symptoms of anhedonia. Patient endorses/denies hopelessness, helplessness, or guilt.   . Severity: Depression: 8/10 (0=Very depressed; 5=Neutral; 10=Very Happy)  Anxiety- 1/10 (0=no anxiety; 5= moderate/tolerable anxiety; 10= panic attacks)  . Duration: Started at age 18-no precipitating causes.   . Timing: No specific time of day of time of year.  . Context: No specific context.  . Modifying factors: Improved with taking antidepressants.  . Associated signs and symptoms: As noted in psychiatric ROS  Suicidal Ideation: Negative Plan Formed: Negative Patient has means to carry out plan: Negative  Homicidal Ideation: Negative Plan Formed: Negative Patient has means to carry out plan: Negative  Review of Systems: Psychiatric: Agitation: Negative Hallucination: Negative Depressed Mood: Negative Insomnia: Yes Hypersomnia: Negative Altered Concentration: Negative Feels Worthless:  Negative Grandiose Ideas: Negative Belief In Special Powers: Negative New/Increased Substance Abuse: Negative Compulsions: Negative  Neurologic: Headache: Negative Seizure: Negative Paresthesias: Negative  Past Medical, Family, Social History:  Past Medical History  Diagnosis Date  . ASCUS (atypical squamous cells of undetermined significance) on Pap smear 03/2006    NEG FOR HISH RISK HPV  . Increased prolactin level 01/2007    WAS 37-- RECHECK WAS 28, 16 in 2011  . LGSIL (low grade squamous intraepithelial dysplasia) 07/2009    C&B, ECC NEGATIVE  . Depression   . Ovarian cyst, left 2014   Family History  Problem Relation Age of Onset  . Breast cancer Mother     metastized to liver  . Alcohol abuse Mother   . Depression Mother   . Breast cancer Father     testicular and then unknown later in life  . Bipolar disorder Sister     History   Social History  . Marital Status: Divorced    Spouse Name: N/A    Number of Children: N/A  . Years of Education: N/A   Social History Main Topics  . Smoking status: Former Games developer  . Smokeless tobacco: Never Used     Comment: quit 28 years ago  . Alcohol Use: 1.8 oz/week    3 Glasses of wine per week     Comment: socially  . Drug Use: No  . Sexual Activity: Yes    Partners: Male    Birth Control/ Protection: Post-menopausal   Other Topics Concern  . None   Social History Narrative  . None     Outpatient Encounter Prescriptions as of 02/14/2013  Medication Sig  . buPROPion (WELLBUTRIN XL) 300 MG 24 hr tablet TAKE 1 TABLET BY MOUTH DAILY  . escitalopram (LEXAPRO) 20 MG tablet TAKE 1 TABLEY BY MOUTH DAILY  .  lamoTRIgine (LAMICTAL) 150 MG tablet TAKE 1 TABLET (150 MG TOTAL) BY MOUTH DAILY.  . mometasone-formoterol (DULERA) 100-5 MCG/ACT AERO Inhale 2 puffs into the lungs 2 (two) times daily.  . [DISCONTINUED] Fluticasone-Salmeterol (ADVAIR DISKUS IN) Inhale into the lungs.    . [DISCONTINUED] ALPRAZolam (XANAX) 0.5 MG  tablet Take 1 tablet (0.5 mg total) by mouth at bedtime as needed for sleep.  . [DISCONTINUED] Celecoxib (CELEBREX PO) Take by mouth.      Past Psychiatric History/Hospitalization(s): Anxiety: Yes Bipolar Disorder: Negative Depression: Yes Mania: Negative Psychosis: Negative Schizophrenia: Negative Personality Disorder: Negative Hospitalization for psychiatric illness: Yes History of Electroconvulsive Shock Therapy: No Prior Suicide Attempts: No  Physical Exam: Constitutional:  BP 124/73  Pulse 68  Ht 5\' 7"  (1.702 m)  Wt 170 lb (77.111 kg)  BMI 26.62 kg/m2  LMP 05/27/2010  General Appearance: alert, oriented, no acute distress, well nourished and obese  Musculoskeletal: Strength & Muscle Tone: within normal limits Gait & Station: normal Patient leans: N/A  Psychiatric: General Appearance: Casual and Well Groomed  Patent attorney::  Good  Speech:  Clear and Coherent and Normal Rate  Volume:  Normal  Mood:  "pretty good" Depression: 8/10 (0=Very depressed; 5=Neutral; 10=Very Happy)  Anxiety- 1/10 (0=no anxiety; 5= moderate/tolerable anxiety; 10= panic attacks)  Affect:  Appropriate, Congruent and Full Range  Thought Process:  Coherent, Linear and Logical  Orientation:  Full (Time, Place, and Person)  Thought Content:  WDL  Suicidal Thoughts:  No  Homicidal Thoughts:  No  Memory:  Immediate;   Good Recent;   Fair Remote;   Fair  Judgement:  Good  Insight:  Good  Psychomotor Activity:  Normal  Concentration:  Negative  Recall:  Negative  Akathisia:  Negative  Handed:  Right  AIMS (if indicated): Not indicated  Assets:  Communication Skills Desire for Improvement Financial Resources/Insurance Housing Intimacy Leisure Time Physical Health Resilience Social Support Talents/Skills Transportation Vocational/Educational  Sleep:  Number of Hours: 7-8     Medical Decision Making (Choose Three): Established Problem, Stable/Improving (1), Review of Psycho-Social  Stressors (1) and Review of Medication Regimen & Side Effects (2)  Assessment: Axis I: Major depressive disorder, recurrent, moderate    Plan:   Plan of Care:  PLAN:  1. Affirm with the patient that the medications are taken as ordered. Patient  expressed understanding of how their medications were to be used.    Laboratory:   No labs warranted at this time.   Psychotherapy: Therapy: brief supportive therapy provided.  Discussed psychosocial stressors in detail.   Medications:  Continue the following psychiatric medications as written prior to this appointment/ with the following changes::  a) buPROPion (WELLBUTRIN XL) 300 MG 24 hr tablet b) escitalopram (LEXAPRO) 20 MG tablet c) lamoTRIgine (LAMICTAL) 150 MG tablet d) Suggested restarting Vit. D 1000 IU as she has had low Vitamin D levels in the past and was supposed to continue a supplement -Risks and benefits, side effects and alternatives discussed with patient, he/she was given an opportunity to ask questions about his/her medication, illness, and treatment. All current psychiatric medications have been reviewed and discussed with the patient and adjusted as clinically appropriate. The patient has been provided an accurate and updated list of the medications being now prescribed.   Routine PRN Medications:  Negative  Consultations: The patient was encouraged to keep all PCP and specialty clinic appointments.   Safety Concerns:   Patient told to call clinic if any problems occur. Patient advised to  go to  ER  if she should develop SI/HI, side effects, or if symptoms worsen. Has crisis numbers to call if needed.    Other:   8. Patient was instructed to return to clinic in 3 months.  9. The patient was advised to call and cancel their mental health appointment within 24 hours of the appointment, if they are unable to keep the appointment, as well as the three no show and termination from clinic policy. 10. The patient expressed  understanding of the plan and agrees with the above.    Jacqulyn Cane, M.D.  02/14/2013 1:01 PM

## 2013-02-16 DIAGNOSIS — F332 Major depressive disorder, recurrent severe without psychotic features: Secondary | ICD-10-CM | POA: Insufficient documentation

## 2013-05-16 ENCOUNTER — Encounter (HOSPITAL_COMMUNITY): Payer: Self-pay | Admitting: Psychiatry

## 2013-05-16 ENCOUNTER — Ambulatory Visit (INDEPENDENT_AMBULATORY_CARE_PROVIDER_SITE_OTHER): Payer: Federal, State, Local not specified - PPO | Admitting: Psychiatry

## 2013-05-16 VITALS — BP 126/95 | HR 76 | Wt 168.0 lb

## 2013-05-16 DIAGNOSIS — F331 Major depressive disorder, recurrent, moderate: Secondary | ICD-10-CM

## 2013-05-16 DIAGNOSIS — F332 Major depressive disorder, recurrent severe without psychotic features: Secondary | ICD-10-CM

## 2013-05-16 MED ORDER — LAMOTRIGINE 150 MG PO TABS: 150.0000 mg | ORAL_TABLET | Freq: Every day | ORAL | Status: DC

## 2013-05-16 MED ORDER — BUPROPION HCL ER (XL) 300 MG PO TB24: 300.0000 mg | ORAL_TABLET | Freq: Every day | ORAL | Status: DC

## 2013-05-16 MED ORDER — ESCITALOPRAM OXALATE 20 MG PO TABS: 20.0000 mg | ORAL_TABLET | Freq: Every day | ORAL | Status: DC

## 2013-05-16 NOTE — Progress Notes (Signed)
[facility] Behavioral Health Follow-up Outpatient Visit  Lindsey Klein 1955-12-05 [id number] 58 y.o. 05/16/2013 12:58 PM  Chief Complaint: Follow up  History of Present Illness: HPI Comments: Ms. Lindsey Klein is  a 58 y/o female with a past psychiatric history significant for Major Depressive Disorder, recurrent, severe without psychotic features. The patient is referred for psychiatric services for medication management.    . Location: Patient reports she has been doing well and has been working.  . Quality: In the area of affective symptoms, patient appears euthymic. Patient denies current suicidal ideation, intent, or plan. Patient denies current homicidal ideation, intent, or plan. Patient denies auditory hallucinations. Patient denies visual hallucinations. Patient denies symptoms of paranoia. Patient states sleep good, despite approximately 7-8 hours of sleep per night. Appetite is good. Energy level is fluctautes from good to poor. Patient endorses improvement symptoms of anhedonia. Patient denies hopelessness, helplessness, endorses some guilt.   . Severity: Depression: 7-8/10 (0=Very depressed; 5=Neutral; 10=Very Happy)  Anxiety- 1/10 (0=no anxiety; 5= moderate/tolerable anxiety; 10= panic attacks)  . Duration: Started at age 22-no precipitating causes.   . Timing: No specific time of day of time of year.  . Context: No specific context.  . Modifying factors: Improved with taking antidepressants.  . Associated signs and symptoms: As noted in psychiatric ROS  Suicidal Ideation: Negative Plan Formed: Negative Patient has means to carry out plan: Negative  Homicidal Ideation: Negative Plan Formed: Negative Patient has means to carry out plan: Negative  Review of Systems: Review of Systems  Constitutional: Negative for fever, chills, weight loss and diaphoresis.  Respiratory: Negative for cough, hemoptysis, sputum production and shortness of breath.   Cardiovascular: Negative  for chest pain, palpitations and leg swelling.  Gastrointestinal: Negative for heartburn, nausea, vomiting, abdominal pain, diarrhea and constipation.  Genitourinary: Negative for dysuria, urgency and frequency.  Skin: Negative for itching and rash.  Neurological: Negative for dizziness and loss of consciousness.    Psychiatric: Agitation: Negative Hallucination: Negative Depressed Mood: Negative Insomnia: No Hypersomnia: Negative Altered Concentration: Negative Feels Worthless: Negative Grandiose Ideas: Negative Belief In Special Powers: Negative New/Increased Substance Abuse: Negative Compulsions: Negative  Neurologic: Headache: Negative Seizure: Negative Paresthesias: Negative  Past Medical, Family, Social History:  Past Medical History  Diagnosis Date  . ASCUS (atypical squamous cells of undetermined significance) on Pap smear 03/2006    NEG FOR HISH RISK HPV  . Increased prolactin level 01/2007    WAS 37-- RECHECK WAS 28, 16 in 2011  . LGSIL (low grade squamous intraepithelial dysplasia) 07/2009    C&B, ECC NEGATIVE  . Depression   . Ovarian cyst, left 2014   Family History  Problem Relation Age of Onset  . Breast cancer Mother     metastized to liver  . Alcohol abuse Mother   . Depression Mother   . Breast cancer Father     testicular and then unknown later in life  . Bipolar disorder Sister    History   Social History  . Marital Status: Divorced    Spouse Name: N/A    Number of Children: N/A  . Years of Education: N/A   Social History Main Topics  . Smoking status: Former Games developer  . Smokeless tobacco: Never Used     Comment: quit 28 years ago  . Alcohol Use: 1.8 oz/week    3 Glasses of wine per week     Comment: socially  . Drug Use: No  . Sexual Activity: Yes    Partners: Male  Birth Control/ Protection: Post-menopausal   Other Topics Concern  . Not on file   Social History Narrative  . No narrative on file   Outpatient Encounter  Prescriptions as of 05/16/2013  Medication Sig  . buPROPion (WELLBUTRIN XL) 300 MG 24 hr tablet Take 1 tablet (300 mg total) by mouth daily.  Marland Kitchen. escitalopram (LEXAPRO) 20 MG tablet Take 1 tablet (20 mg total) by mouth daily.  Marland Kitchen. lamoTRIgine (LAMICTAL) 150 MG tablet Take 1 tablet (150 mg total) by mouth daily.  . mometasone-formoterol (DULERA) 100-5 MCG/ACT AERO Inhale 2 puffs into the lungs 2 (two) times daily.    Past Psychiatric History/Hospitalization(s): Anxiety: Yes Bipolar Disorder: Negative Depression: Yes Mania: Negative Psychosis: Negative Schizophrenia: Negative Personality Disorder: Negative Hospitalization for psychiatric illness: Yes History of Electroconvulsive Shock Therapy: No Prior Suicide Attempts: No  Physical Exam: Constitutional:  BP 126/95  Pulse 76  Wt 168 lb (76.204 kg)  LMP 05/27/2010  General Appearance: alert, oriented, no acute distress, well nourished and obese Musculoskeletal: Gait & Station: normal Patient leans: N/A  Psychiatric: General Appearance: Casual and Well Groomed  Patent attorneyye Contact::  Good  Speech:  Clear and Coherent and Normal Rate  Volume:  Normal  Mood:  "good"   Affect:  Appropriate, Congruent and Full Range  Thought Process:  Coherent, Linear and Logical  Orientation:  Full (Time, Place, and Person)  Thought Content:  WDL  Suicidal Thoughts:  No  Homicidal Thoughts:  No  Memory:  Immediate;   Good Recent;   Fair Remote;   Fair  Judgement:  Good  Insight:  Good  Psychomotor Activity:  Normal  Concentration:  Negative  Recall:  Negative  Akathisia:  Negative  Handed:  Right  AIMS (if indicated): Not indicated  Assets:  Communication Skills Desire for Improvement Financial Resources/Insurance Housing Intimacy Leisure Time Physical Health Resilience Social Support Talents/Skills Transportation Vocational/Educational  Sleep:  Number of Hours: 7-8     Medical Decision Making (Choose Three): Established Problem,  Stable/Improving (1), Review of Psycho-Social Stressors (1) and Review of Medication Regimen & Side Effects (2)  Assessment: Axis I: Major depressive disorder, recurrent, moderate-stable   Plan:   Plan of Care:  PLAN:  1. Affirm with the patient that the medications are taken as ordered. Patient  expressed understanding of how their medications were to be used.    Laboratory:   No labs warranted at this time.   Psychotherapy: Therapy: brief supportive therapy provided.  Discussed psychosocial stressors in detail. More than 50% of the visit was spent on individual therapy/counseling.   Medications:  Continue the following psychiatric medications as written prior to this appointment with the following changes::  a) buPROPion (WELLBUTRIN XL) 300 MG 24 hr tablet-No change in dose. b) escitalopram (LEXAPRO) 20 MG tablet--No change in dose. c) lamoTRIgine (LAMICTAL) 150 MG tablet--No change in dose.  -Risks and benefits, side effects and alternatives discussed with patient, she was given an opportunity to ask questions about her medication, illness, and treatment. All current psychiatric medications have been reviewed and discussed with the patient and adjusted as clinically appropriate. The patient has been provided an accurate and updated list of the medications being now prescribed.   Routine PRN Medications:  Negative  Consultations: The patient was encouraged to keep all PCP and specialty clinic appointments.   Safety Concerns:   Patient told to call clinic if any problems occur. Patient advised to go to  ER  if she should develop SI/HI, side effects, or if  symptoms worsen. Has crisis numbers to call if needed.    Other:   8. Patient was instructed to return to clinic in 3 months.  9. The patient was advised to call and cancel their mental health appointment within 24 hours of the appointment, if they are unable to keep the appointment, as well as the three no show and termination from  clinic policy. 10. The patient expressed understanding of the plan and agrees with the above. 11. Patient informed that April 15th, 2015 would be my last day at this clinic.   Time spent: 25 minutes Jacqulyn Cane, M.D.  05/16/2013 12:58 PM

## 2013-06-27 ENCOUNTER — Ambulatory Visit (HOSPITAL_COMMUNITY): Payer: Self-pay | Admitting: Psychiatry

## 2013-09-04 ENCOUNTER — Encounter (INDEPENDENT_AMBULATORY_CARE_PROVIDER_SITE_OTHER): Payer: Self-pay

## 2013-09-04 ENCOUNTER — Ambulatory Visit (INDEPENDENT_AMBULATORY_CARE_PROVIDER_SITE_OTHER): Payer: Federal, State, Local not specified - PPO | Admitting: Psychiatry

## 2013-09-04 ENCOUNTER — Encounter (HOSPITAL_COMMUNITY): Payer: Self-pay | Admitting: Psychiatry

## 2013-09-04 VITALS — HR 90 | Ht 65.0 in | Wt 162.0 lb

## 2013-09-04 DIAGNOSIS — F332 Major depressive disorder, recurrent severe without psychotic features: Secondary | ICD-10-CM

## 2013-09-04 DIAGNOSIS — F331 Major depressive disorder, recurrent, moderate: Secondary | ICD-10-CM

## 2013-09-04 MED ORDER — ESCITALOPRAM OXALATE 20 MG PO TABS: 20.0000 mg | ORAL_TABLET | Freq: Every day | ORAL | Status: DC

## 2013-09-04 MED ORDER — LAMOTRIGINE 150 MG PO TABS: 150.0000 mg | ORAL_TABLET | Freq: Every day | ORAL | Status: DC

## 2013-09-04 MED ORDER — BUPROPION HCL ER (XL) 300 MG PO TB24: 300.0000 mg | ORAL_TABLET | Freq: Every day | ORAL | Status: DC

## 2013-09-04 NOTE — Progress Notes (Signed)
Patient ID: [name], female   DOB: 04-25-1955, 58 y.o.   MRN: [id number]   [facility] Follow-up Outpatient Visit  JACQUELYNN FRIEND 20-Dec-1955 [id number] 58 y.o. 09/04/2013 10:07 AM  Chief Complaint: Follow up  History of Present Illness: HPI Comments: Ms. Delawder is  a 58 y/o female with a past psychiatric history significant for Major Depressive Disorder, recurrent, severe without psychotic features. The patient is referred for psychiatric services for medication management.    . Location: Patient reports she has been doing well and has been working.  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  . Quality: Injured her left arm in cast. Sleeps reasonable. Lives by herself In the area of affective symptoms, patient appears euthymic. Patient denies current suicidal ideation, intent, or plan. Patient denies current homicidal ideation, intent, or plan. Patient denies auditory hallucinations. Patient denies visual hallucinations. Patient denies symptoms of paranoia. Patient states sleep good, despite approximately 7-8 hours of sleep per night. Appetite is good. Energy level is fluctautes from good to poor. Patient endorses improvement symptoms of anhedonia. Patient denies hopelessness, helplessness, endorses some guilt.   . Severity: Depression:8/10 (0=Very depressed; 5=Neutral; 10=Very Happy)  Anxiety- 1/10 (0=no anxiety; 5= moderate/tolerable anxiety; 10= panic attacks)  . Duration: Started at age 58-no precipitating causes. Says it runs in family.  . Timing: No specific time of day of time of year. Will review how winter effects her.   . Context: No specific context.  . Modifying factors: Improved with taking antidepressants. No clear manic or psychotic symptoms. We talked about cutting down on lamictal which she wants.  . Associated signs and symptoms: As noted in psychiatric ROS  Suicidal Ideation: Negative Plan Formed: Negative Patient has means to carry out plan:  Negative  Homicidal Ideation: Negative Plan Formed: Negative Patient has means to carry out plan: Negative  Review of Systems: Review of Systems  Constitutional: Negative for fever, chills, weight loss and diaphoresis.  Respiratory: Negative for cough, hemoptysis, sputum production and shortness of breath.   Cardiovascular: Negative for chest pain, palpitations and leg swelling.  Gastrointestinal: Negative for heartburn, nausea, vomiting, abdominal pain, diarrhea and constipation.  Genitourinary: Negative for dysuria, urgency and frequency.  Skin: Negative for itching and rash.  Neurological: Negative for dizziness and loss of consciousness.    Psychiatric: Agitation: Negative Hallucination: Negative Depressed Mood: Negative Insomnia: No Hypersomnia: Negative Altered Concentration: Negative Feels Worthless: Negative Grandiose Ideas: Negative Belief In Special Powers: Negative New/Increased Substance Abuse: Negative Compulsions: Negative  Neurologic: Headache: Negative Seizure: Negative Paresthesias: Negative  Past Medical, Family, Social History:  Past Medical History  Diagnosis Date  . ASCUS (atypical squamous cells of undetermined significance) on Pap smear 03/2006    NEG FOR HISH RISK HPV  . Increased prolactin level 01/2007    WAS 37-- RECHECK WAS 28, 16 in 2011  . LGSIL (low grade squamous intraepithelial dysplasia) 07/2009    C&B, ECC NEGATIVE  . Depression   . Ovarian cyst, left 2014   Family History  Problem Relation Age of Onset  . Breast cancer Mother     metastized to liver  . Alcohol abuse Mother   . Depression Mother   . Breast cancer Father     testicular and then unknown later in life  . Bipolar disorder Sister    History   Social History  . Marital Status: Divorced    Spouse Name: N/A    Number of Children: N/A  . Years of Education: N/A  Social History Main Topics  . Smoking status: Former Games developermoker  . Smokeless tobacco: Never Used      Comment: quit 28 years ago  . Alcohol Use: 0.6 oz/week    1 Glasses of wine per week     Comment: socially  . Drug Use: No  . Sexual Activity: Yes    Partners: Male    Birth Control/ Protection: Post-menopausal   Other Topics Concern  . None   Social History Narrative  . None   Outpatient Encounter Prescriptions as of 09/04/2013  Medication Sig  . buPROPion (WELLBUTRIN XL) 300 MG 24 hr tablet Take 1 tablet (300 mg total) by mouth daily.  Marland Kitchen. escitalopram (LEXAPRO) 20 MG tablet Take 1 tablet (20 mg total) by mouth daily.  Marland Kitchen. lamoTRIgine (LAMICTAL) 150 MG tablet Take 1 tablet (150 mg total) by mouth daily.  . mometasone-formoterol (DULERA) 100-5 MCG/ACT AERO Inhale 2 puffs into the lungs 2 (two) times daily.  . montelukast (SINGULAIR) 10 MG tablet Take 10 mg by mouth at bedtime.  . [DISCONTINUED] buPROPion (WELLBUTRIN XL) 300 MG 24 hr tablet Take 1 tablet (300 mg total) by mouth daily.  . [DISCONTINUED] escitalopram (LEXAPRO) 20 MG tablet Take 1 tablet (20 mg total) by mouth daily.  . [DISCONTINUED] lamoTRIgine (LAMICTAL) 150 MG tablet Take 1 tablet (150 mg total) by mouth daily.    Past Psychiatric History/Hospitalization(s): Anxiety: Yes Bipolar Disorder: Negative Depression: Yes Mania: Negative Psychosis: Negative Schizophrenia: Negative Personality Disorder: Negative Hospitalization for psychiatric illness: Yes History of Electroconvulsive Shock Therapy: No Prior Suicide Attempts: No  Physical Exam: Constitutional:  Pulse 90  Ht 5\' 5"  (1.651 m)  Wt 162 lb (73.483 kg)  BMI 26.96 kg/m2  LMP 05/27/2010  General Appearance: alert, oriented, no acute distress, well nourished and obese Musculoskeletal: Gait & Station: normal Patient leans: N/A  Psychiatric: General Appearance: Casual and Well Groomed  Patent attorneyye Contact::  Good  Speech:  Clear and Coherent and Normal Rate  Volume:  Normal  Mood:  "good"   Affect:  Appropriate, Congruent and Full Range  Thought Process:   Coherent, Linear and Logical  Orientation:  Full (Time, Place, and Person)  Thought Content:  WDL  Suicidal Thoughts:  No  Homicidal Thoughts:  No  Memory:  Immediate;   Good Recent;   Fair Remote;   Fair  Judgement:  Good  Insight:  Good  Psychomotor Activity:  Normal  Concentration:  Negative  Recall:  Negative  Akathisia:  Negative  Handed:  Right  AIMS (if indicated): Not indicated  Assets:  Communication Skills Desire for Improvement Financial Resources/Insurance Housing Intimacy Leisure Time Physical Health Resilience Social Support Talents/Skills Transportation Vocational/Educational  Sleep:  Number of Hours: 7-8     Medical Decision Making (Choose Three): Established Problem, Stable/Improving (1), Review of Psycho-Social Stressors (1) and Review of Medication Regimen & Side Effects (2)  Assessment: Axis I: Major depressive disorder, recurrent, moderate-stable   Plan:   Plan of Care:  PLAN:  1. Affirm with the patient that the medications are taken as ordered. Patient  expressed understanding of how their medications were to be used.    Laboratory:   No labs warranted at this time.   Psychotherapy: Therapy: brief supportive therapy provided.  Discussed psychosocial stressors in detail. More than 50% of the visit was spent on individual therapy/counseling.   Medications:  Continue the following psychiatric medications as written prior to this appointment with the following changes::  a) buPROPion (WELLBUTRIN XL) 300 MG 24  hr tablet-No change in dose. b) escitalopram (LEXAPRO) 20 MG tablet--No change in dose. c) lamoTRIgine (LAMICTAL) 150 MG tablet-- She will start taking 75 mg 1 month prior to her next visit. We will assess if any worsening of symptoms or she remains baseline.  -Risks and benefits, side effects and alternatives discussed with patient, she was given an opportunity to ask questions about her medication, illness, and treatment. All current  psychiatric medications have been reviewed and discussed with the patient and adjusted as clinically appropriate. The patient has been provided an accurate and updated list of the medications being now prescribed.   Routine PRN Medications:  Negative  Consultations: The patient was encouraged to keep all PCP and specialty clinic appointments.   Safety Concerns:   Patient told to call clinic if any problems occur. Patient advised to go to  ER  if she should develop SI/HI, side effects, or if symptoms worsen. Has crisis numbers to call if needed.    Other:   8. Patient was instructed to return to clinic in 3 months.  9. The patient was advised to call and cancel their mental health appointment within 24 hours of the appointment, if they are unable to keep the appointment, as well as the three no show and termination from clinic policy. 10. The patient expressed understanding of the plan and agrees with the above. lamictal will be half dose at next visit.    Time spent: 25 minutes Thresa Ross, M.D.  09/04/2013 10:07 AM

## 2013-09-11 DIAGNOSIS — M79641 Pain in right hand: Secondary | ICD-10-CM | POA: Insufficient documentation

## 2013-09-11 DIAGNOSIS — M151 Heberden's nodes (with arthropathy): Secondary | ICD-10-CM | POA: Insufficient documentation

## 2013-09-11 DIAGNOSIS — M1990 Unspecified osteoarthritis, unspecified site: Secondary | ICD-10-CM | POA: Insufficient documentation

## 2013-10-25 ENCOUNTER — Encounter (HOSPITAL_COMMUNITY): Payer: Self-pay

## 2013-12-05 ENCOUNTER — Ambulatory Visit (HOSPITAL_COMMUNITY): Payer: Self-pay | Admitting: Psychiatry

## 2013-12-27 ENCOUNTER — Encounter (INDEPENDENT_AMBULATORY_CARE_PROVIDER_SITE_OTHER): Payer: Self-pay

## 2013-12-27 ENCOUNTER — Ambulatory Visit (INDEPENDENT_AMBULATORY_CARE_PROVIDER_SITE_OTHER): Payer: Federal, State, Local not specified - PPO | Admitting: Psychiatry

## 2013-12-27 VITALS — BP 125/85 | HR 78 | Ht 64.0 in | Wt 168.0 lb

## 2013-12-27 DIAGNOSIS — F331 Major depressive disorder, recurrent, moderate: Secondary | ICD-10-CM

## 2013-12-27 MED ORDER — LAMOTRIGINE 150 MG PO TABS: 150.0000 mg | ORAL_TABLET | Freq: Every day | ORAL | Status: DC

## 2013-12-27 MED ORDER — ESCITALOPRAM OXALATE 20 MG PO TABS: 20.0000 mg | ORAL_TABLET | Freq: Every day | ORAL | Status: DC

## 2013-12-27 MED ORDER — BUPROPION HCL ER (SR) 200 MG PO TB12: 200.0000 mg | ORAL_TABLET | Freq: Every day | ORAL | Status: DC

## 2013-12-27 NOTE — Progress Notes (Signed)
Patient ID: Lindsey Klein, female   DOB: [birth year], 58 y.o.   MRN: [id number]   [facility] Follow-up Outpatient Visit  Lindsey Klein 1955/10/06 [id number] 58 y.o. 12/27/2013 3:41 PM  Chief Complaint: Follow up  History of Present Illness: HPI Comments: [name] is  a 58 y/o female with a past psychiatric history significant for Major Depressive Disorder, recurrent, severe without psychotic features. The patient is referred for psychiatric services for medication management.    . Location: Patient reports she has been doing well and has been working.  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  . Quality: Injured her left arm in cast. Sleeps reasonable. Lives by herself. Some tremulousness noticed by her allergist. More prominent when she outstretch her arms. No panic attacks but endorses generalized worries.   In the area of affective symptoms, patient appears euthymic. Patient denies current suicidal ideation, intent, or plan. Patient denies current homicidal ideation, intent, or plan. Patient denies auditory hallucinations. Patient denies visual hallucinations. Patient denies symptoms of paranoia. Patient states sleep good, despite approximately 7-8 hours of sleep per night. Appetite is good. Energy level is fluctautes from good to poor. Patient endorses improvement symptoms of anhedonia. Patient denies hopelessness, helplessness, endorses some guilt.   . Severity: Depression:8/10 (0=Very depressed; 5=Neutral; 10=Very Happy)  Anxiety- 1/10 (0=no anxiety; 5= moderate/tolerable anxiety; 10= panic attacks)  . Duration: Started at age 41-no precipitating causes. Says it runs in family.  . Timing: No specific time of day of time of year. Will review how winter effects her.   . Context: No specific context.  . Modifying factors: Improved with taking antidepressants. No clear manic or psychotic symptoms. We talked about cutting down on lamictal which she wants.  . Associated  signs and symptoms: As noted in psychiatric ROS  Suicidal Ideation: Negative Plan Formed: Negative Patient has means to carry out plan: Negative  Homicidal Ideation: Negative Plan Formed: Negative Patient has means to carry out plan: Negative  Review of Systems: Review of Systems  Constitutional: Negative for fever and chills.  HENT: Negative for hearing loss.   Cardiovascular: Negative for chest pain.  Gastrointestinal: Negative for nausea.  Skin: Negative for itching and rash.  Neurological: Positive for tremors. Negative for dizziness, loss of consciousness and headaches.  Psychiatric/Behavioral: Negative for depression, suicidal ideas, hallucinations and substance abuse. The patient is nervous/anxious. The patient does not have insomnia.      Neurologic: Headache: Negative Seizure: Negative Paresthesias: Negative  Past Medical, Family, Social History:  Past Medical History  Diagnosis Date  . ASCUS (atypical squamous cells of undetermined significance) on Pap smear 03/2006    NEG FOR HISH RISK HPV  . Increased prolactin level 01/2007    WAS 37-- RECHECK WAS 28, 16 in 2011  . LGSIL (low grade squamous intraepithelial dysplasia) 07/2009    C&B, ECC NEGATIVE  . Depression   . Ovarian cyst, left 2014   Family History  Problem Relation Age of Onset  . Breast cancer Mother     metastized to liver  . Alcohol abuse Mother   . Depression Mother   . Breast cancer Father     testicular and then unknown later in life  . Bipolar disorder Sister    History   Social History  . Marital Status: Divorced    Spouse Name: N/A    Number of Children: N/A  . Years of Education: N/A   Social History Main Topics  . Smoking status: Former Games developer  .  Smokeless tobacco: Never Used     Comment: quit 28 years ago  . Alcohol Use: 0.6 oz/week    1 Glasses of wine per week     Comment: socially  . Drug Use: No  . Sexual Activity: Yes    Partners: Male    Birth Control/ Protection:  Post-menopausal   Other Topics Concern  . Not on file   Social History Narrative  . No narrative on file   Outpatient Encounter Prescriptions as of 12/27/2013  Medication Sig  . buPROPion (WELLBUTRIN SR) 200 MG 12 hr tablet Take 1 tablet (200 mg total) by mouth daily.  Marland Kitchen. escitalopram (LEXAPRO) 20 MG tablet Take 1 tablet (20 mg total) by mouth daily.  Marland Kitchen. lamoTRIgine (LAMICTAL) 150 MG tablet Take 1 tablet (150 mg total) by mouth daily.  . mometasone-formoterol (DULERA) 100-5 MCG/ACT AERO Inhale 2 puffs into the lungs 2 (two) times daily.  . montelukast (SINGULAIR) 10 MG tablet Take 10 mg by mouth at bedtime.  . [DISCONTINUED] buPROPion (WELLBUTRIN XL) 300 MG 24 hr tablet Take 1 tablet (300 mg total) by mouth daily.  . [DISCONTINUED] escitalopram (LEXAPRO) 20 MG tablet Take 1 tablet (20 mg total) by mouth daily.  . [DISCONTINUED] lamoTRIgine (LAMICTAL) 150 MG tablet Take 1 tablet (150 mg total) by mouth daily.     Physical Exam: Constitutional:  BP 125/85  Pulse 78  Ht 5\' 4"  (1.626 m)  Wt 168 lb (76.204 kg)  BMI 28.82 kg/m2  LMP 05/27/2010  General Appearance: alert, oriented, no acute distress, well nourished and obese Musculoskeletal: Gait & Station: normal Patient leans: N/A  Psychiatric: General Appearance: Casual and Well Groomed  Patent attorneyye Contact::  Good  Speech:  Clear and Coherent and Normal Rate  Volume:  Normal  Mood:  "good"   Affect:  Appropriate, Congruent and Full Range  Thought Process:  Coherent, Linear and Logical  Orientation:  Full (Time, Place, and Person)  Thought Content:  WDL  Suicidal Thoughts:  No  Homicidal Thoughts:  No  Memory:  Immediate;   Good Recent;   Fair Remote;   Fair  Judgement:  Good  Insight:  Good  Psychomotor Activity:  Normal  Concentration:  Negative  Recall:  Negative  Akathisia:  Negative  Handed:  Right  AIMS (if indicated): Not indicated  Assets:  Communication Skills Desire for Improvement Financial  Resources/Insurance Housing Intimacy Leisure Time Physical Health Resilience Social Support Talents/Skills Transportation Vocational/Educational  Sleep:  Number of Hours: 7-8     Medical Decision Making (Choose Three): Established Problem, Stable/Improving (1), Review of Psycho-Social Stressors (1) and Review of Medication Regimen & Side Effects (2)  Assessment: Axis I: Major depressive disorder, recurrent, moderate-stable   Plan:   Plan of Care:  PLAN:  1. Affirm with the patient that the medications are taken as ordered. Patient  expressed understanding of how their medications were to be used.    Laboratory:   No labs warranted at this time.   Psychotherapy: Therapy: brief supportive therapy provided.  Discussed psychosocial stressors in detail. More than 50% of the visit was spent on individual therapy/counseling.   Medications:  Continue the following psychiatric medications as written prior to this appointment with the following changes::  a) Decrease wellbutrin to 200mg  and evaluate and change in tremors. b) escitalopram (LEXAPRO) 20 MG tablet--No change in dose. c) lamoTRIgine (LAMICTAL) 150 MG tablet--  Follow up with other providers for any other medication contributing to tremors. Has been on claritin before. -Risks  and benefits, side effects and alternatives discussed with patient, she was given an opportunity to ask questions about her medication, illness, and treatment. All current psychiatric medications have been reviewed and discussed with the patient and adjusted as clinically appropriate. The patient has been provided an accurate and updated list of the medications being now prescribed.   Routine PRN Medications:  Negative  Consultations: The patient was encouraged to keep all PCP and specialty clinic appointments.   Safety Concerns:   Patient told to call clinic if any problems occur. Patient advised to go to  ER  if she should develop SI/HI, side effects,  or if symptoms worsen. Has crisis numbers to call if needed.    Other:   8. Patient was instructed to return to clinic in 2 months.  9. The patient was advised to call and cancel their mental health appointment within 24 hours of the appointment, if they are unable to keep the appointment, as well as the three no show and termination from clinic policy. 10. The patient expressed understanding of the plan and agrees with the above. lamictal will be half dose at next visit.    Time spent: 25 minutes Thresa Ross, M.D.  12/27/2013 3:41 PM

## 2014-01-01 ENCOUNTER — Other Ambulatory Visit: Payer: Self-pay | Admitting: Nurse Practitioner

## 2014-01-01 DIAGNOSIS — Z1231 Encounter for screening mammogram for malignant neoplasm of breast: Secondary | ICD-10-CM

## 2014-01-02 ENCOUNTER — Ambulatory Visit (INDEPENDENT_AMBULATORY_CARE_PROVIDER_SITE_OTHER): Payer: Federal, State, Local not specified - PPO

## 2014-01-02 DIAGNOSIS — R928 Other abnormal and inconclusive findings on diagnostic imaging of breast: Secondary | ICD-10-CM

## 2014-01-02 DIAGNOSIS — Z1231 Encounter for screening mammogram for malignant neoplasm of breast: Secondary | ICD-10-CM

## 2014-01-03 ENCOUNTER — Other Ambulatory Visit: Payer: Self-pay | Admitting: Nurse Practitioner

## 2014-01-03 DIAGNOSIS — R928 Other abnormal and inconclusive findings on diagnostic imaging of breast: Secondary | ICD-10-CM

## 2014-01-15 ENCOUNTER — Other Ambulatory Visit: Payer: Self-pay

## 2014-01-23 ENCOUNTER — Ambulatory Visit
Admission: RE | Admit: 2014-01-23 | Discharge: 2014-01-23 | Disposition: A | Discharge status: Home / Self Care | Payer: Federal, State, Local not specified - PPO | Source: Ambulatory Visit | Attending: Nurse Practitioner | Admitting: Nurse Practitioner

## 2014-01-23 DIAGNOSIS — R928 Other abnormal and inconclusive findings on diagnostic imaging of breast: Secondary | ICD-10-CM

## 2014-01-27 ENCOUNTER — Encounter (HOSPITAL_COMMUNITY): Payer: Self-pay | Admitting: Psychiatry

## 2014-02-05 ENCOUNTER — Encounter (HOSPITAL_COMMUNITY): Payer: Self-pay | Admitting: Emergency Medicine

## 2014-02-05 ENCOUNTER — Emergency Department (HOSPITAL_COMMUNITY)
Admission: EM | Admit: 2014-02-05 | Discharge: 2014-02-05 | Disposition: A | Discharge status: Home / Self Care | Payer: Federal, State, Local not specified - PPO | Attending: Emergency Medicine | Admitting: Emergency Medicine

## 2014-02-05 DIAGNOSIS — Y9289 Other specified places as the place of occurrence of the external cause: Secondary | ICD-10-CM | POA: Diagnosis not present

## 2014-02-05 DIAGNOSIS — Z8639 Personal history of other endocrine, nutritional and metabolic disease: Secondary | ICD-10-CM | POA: Diagnosis not present

## 2014-02-05 DIAGNOSIS — F329 Major depressive disorder, single episode, unspecified: Secondary | ICD-10-CM | POA: Diagnosis not present

## 2014-02-05 DIAGNOSIS — Z7951 Long term (current) use of inhaled steroids: Secondary | ICD-10-CM | POA: Diagnosis not present

## 2014-02-05 DIAGNOSIS — Z880 Allergy status to penicillin: Secondary | ICD-10-CM | POA: Insufficient documentation

## 2014-02-05 DIAGNOSIS — S0990XA Unspecified injury of head, initial encounter: Secondary | ICD-10-CM | POA: Diagnosis present

## 2014-02-05 DIAGNOSIS — Y990 Civilian activity done for income or pay: Secondary | ICD-10-CM | POA: Insufficient documentation

## 2014-02-05 DIAGNOSIS — W01198A Fall on same level from slipping, tripping and stumbling with subsequent striking against other object, initial encounter: Secondary | ICD-10-CM | POA: Diagnosis not present

## 2014-02-05 DIAGNOSIS — S0083XA Contusion of other part of head, initial encounter: Secondary | ICD-10-CM | POA: Diagnosis not present

## 2014-02-05 DIAGNOSIS — Y9389 Activity, other specified: Secondary | ICD-10-CM | POA: Insufficient documentation

## 2014-02-05 DIAGNOSIS — Z87891 Personal history of nicotine dependence: Secondary | ICD-10-CM | POA: Diagnosis not present

## 2014-02-05 DIAGNOSIS — Z8742 Personal history of other diseases of the female genital tract: Secondary | ICD-10-CM | POA: Insufficient documentation

## 2014-02-05 NOTE — ED Provider Notes (Signed)
CSN: [id number]     Arrival date & time 02/05/14  0215 History   First MD Initiated Contact with Patient 02/05/14 0301     Chief Complaint  Patient presents with  . Fall  . Head Injury     (Consider location/radiation/quality/duration/timing/severity/associated sxs/prior Treatment) HPI 58 year old female presents to the emergency department from work after having a fall.  Patient works in the post office, reports that she tripped over a post that was not secured to the ground.  Patient landed on her knees and hit her left forehead on the concrete floor.  She denies any LOC no change in vision no nausea.  Patient has tenderness to her forehead but no other complaints. Past Medical History  Diagnosis Date  . ASCUS (atypical squamous cells of undetermined significance) on Pap smear 03/2006    NEG FOR HISH RISK HPV  . Increased prolactin level 01/2007    WAS 37-- RECHECK WAS 28, 16 in 2011  . LGSIL (low grade squamous intraepithelial dysplasia) 07/2009    C&B, ECC NEGATIVE  . Depression   . Ovarian cyst, left 2014   Past Surgical History  Procedure Laterality Date  . Colposcopy  05/11     LGSIL--C&B-ECC NEG  . Cesarean section  1986  . Tonsillectomy and adenoidectomy    . Nasal septum surgery    . Rotator cuff repair  2009    LEFT  . Foot surgery  2007    BILATERAL  . Polypectomy     Family History  Problem Relation Age of Onset  . Breast cancer Mother     metastized to liver  . Alcohol abuse Mother   . Depression Mother   . Breast cancer Father     testicular and then unknown later in life  . Bipolar disorder Sister    History  Substance Use Topics  . Smoking status: Former Games developermoker  . Smokeless tobacco: Never Used     Comment: quit 28 years ago  . Alcohol Use: 0.6 oz/week    1 Glasses of wine per week     Comment: socially   OB History    Gravida Para Term Preterm AB TAB SAB Ectopic Multiple Living   5 1   4 2 2   1      Review of Systems   See History of  Present Illness; otherwise all other systems are reviewed and negative  Allergies  Penicillins  Home Medications   Prior to Admission medications   Medication Sig Start Date End Date Taking? Authorizing Provider  buPROPion (WELLBUTRIN SR) 200 MG 12 hr tablet Take 1 tablet (200 mg total) by mouth daily. 12/27/13 12/27/14 Yes Thresa RossNadeem Akhtar, MD  escitalopram (LEXAPRO) 20 MG tablet Take 1 tablet (20 mg total) by mouth daily. 12/27/13  Yes Thresa RossNadeem Akhtar, MD  ibuprofen (ADVIL,MOTRIN) 200 MG tablet Take 400 mg by mouth every 6 (six) hours as needed for moderate pain.   Yes Historical Provider, MD  lamoTRIgine (LAMICTAL) 150 MG tablet Take 1 tablet (150 mg total) by mouth daily. 12/27/13  Yes Thresa RossNadeem Akhtar, MD  mometasone-formoterol (DULERA) 100-5 MCG/ACT AERO Inhale 2 puffs into the lungs 2 (two) times daily.   Yes Historical Provider, MD  montelukast (SINGULAIR) 10 MG tablet Take 10 mg by mouth at bedtime.    Historical Provider, MD   BP 155/80 mmHg  Pulse 76  Temp(Src) 97.7 F (36.5 C) (Oral)  SpO2 100%  LMP 05/27/2010 Physical Exam  Constitutional: She is oriented to person, place,  and time. She appears well-developed and well-nourished.  HENT:  Head: Normocephalic.  Right Ear: External ear normal.  Left Ear: External ear normal.  Nose: Nose normal.  Mouth/Throat: Oropharynx is clear and moist.  Patient has large contusion over left eyebrow no step-off or crepitus.  Eyes: Conjunctivae and EOM are normal. Pupils are equal, round, and reactive to light.  Neck: Normal range of motion. Neck supple. No JVD present. No tracheal deviation present. No thyromegaly present.  Cardiovascular: Normal rate, regular rhythm, normal heart sounds and intact distal pulses.  Exam reveals no gallop and no friction rub.   No murmur heard. Pulmonary/Chest: Effort normal and breath sounds normal. No stridor. No respiratory distress. She has no wheezes. She has no rales. She exhibits no tenderness.  Abdominal:  Soft. Bowel sounds are normal. She exhibits no distension and no mass. There is no tenderness. There is no rebound and no guarding.  Musculoskeletal: Normal range of motion. She exhibits no edema or tenderness.  Lymphadenopathy:    She has no cervical adenopathy.  Neurological: She is alert and oriented to person, place, and time. She displays normal reflexes. No cranial nerve deficit. She exhibits normal muscle tone. Coordination normal.  Skin: Skin is warm and dry. No rash noted. No erythema. No pallor.  Psychiatric: She has a normal mood and affect. Her behavior is normal. Judgment and thought content normal.  Nursing note and vitals reviewed.   ED Course  Procedures (including critical care time) Labs Review Labs Reviewed - No data to display  Imaging Review No results found.   EKG Interpretation None      MDM   Final diagnoses:  Forehead contusion, initial encounter    58 year old female tripped and fall work, minor head injury with large contusion over her left eyebrow.  No LOC, no nausea or vomiting normal neuro exam.  She does not need further imaging.  Paperwork required by [place] [employer] completed.    Olivia Mackielga M Collier Bohnet, MD 02/05/14 [phone]

## 2014-02-05 NOTE — ED Notes (Signed)
Pt states that she was at work and tripped, falling and hitting her head. Denies LOC. Alert and oriented.

## 2014-02-05 NOTE — Discharge Instructions (Signed)
Cryotherapy °Cryotherapy means treatment with cold. Ice or gel packs can be used to reduce both pain and swelling. Ice is the most helpful within the first 24 to 48 hours after an injury or flare-up from overusing a muscle or joint. Sprains, strains, spasms, burning pain, shooting pain, and aches can all be eased with ice. Ice can also be used when recovering from surgery. Ice is effective, has very few side effects, and is safe for most people to use. °PRECAUTIONS  °Ice is not a safe treatment option for people with: °· Raynaud phenomenon. This is a condition affecting small blood vessels in the extremities. Exposure to cold may cause your problems to return. °· Cold hypersensitivity. There are many forms of cold hypersensitivity, including: °· Cold urticaria. Red, itchy hives appear on the skin when the tissues begin to warm after being iced. °· Cold erythema. This is a red, itchy rash caused by exposure to cold. °· Cold hemoglobinuria. Red blood cells break down when the tissues begin to warm after being iced. The hemoglobin that carry oxygen are passed into the urine because they cannot combine with blood proteins fast enough. °· Numbness or altered sensitivity in the area being iced. °If you have any of the following conditions, do not use ice until you have discussed cryotherapy with your caregiver: °· Heart conditions, such as arrhythmia, angina, or chronic heart disease. °· High blood pressure. °· Healing wounds or open skin in the area being iced. °· Current infections. °· Rheumatoid arthritis. °· Poor circulation. °· Diabetes. °Ice slows the blood flow in the region it is applied. This is beneficial when trying to stop inflamed tissues from spreading irritating chemicals to surrounding tissues. However, if you expose your skin to cold temperatures for too long or without the proper protection, you can damage your skin or nerves. Watch for signs of skin damage due to cold. °HOME CARE INSTRUCTIONS °Follow  these tips to use ice and cold packs safely. °· Place a dry or damp towel between the ice and skin. A damp towel will cool the skin more quickly, so you may need to shorten the time that the ice is used. °· For a more rapid response, add gentle compression to the ice. °· Ice for no more than 10 to 20 minutes at a time. The bonier the area you are icing, the less time it will take to get the benefits of ice. °· Check your skin after 5 minutes to make sure there are no signs of a poor response to cold or skin damage. °· Rest 20 minutes or more between uses. °· Once your skin is numb, you can end your treatment. You can test numbness by very lightly touching your skin. The touch should be so light that you do not see the skin dimple from the pressure of your fingertip. When using ice, most people will feel these normal sensations in this order: cold, burning, aching, and numbness. °· Do not use ice on someone who cannot communicate their responses to pain, such as small children or people with dementia. °HOW TO MAKE AN ICE PACK °Ice packs are the most common way to use ice therapy. Other methods include ice massage, ice baths, and cryosprays. Muscle creams that cause a cold, tingly feeling do not offer the same benefits that ice offers and should not be used as a substitute unless recommended by your caregiver. °To make an ice pack, do one of the following: °· Place crushed ice or a   bag of frozen vegetables in a sealable plastic bag. Squeeze out the excess air. Place this bag inside another plastic bag. Slide the bag into a pillowcase or place a damp towel between your skin and the bag. °· Mix 3 parts water with 1 part rubbing alcohol. Freeze the mixture in a sealable plastic bag. When you remove the mixture from the freezer, it will be slushy. Squeeze out the excess air. Place this bag inside another plastic bag. Slide the bag into a pillowcase or place a damp towel between your skin and the bag. °SEEK MEDICAL CARE  IF: °· You develop white spots on your skin. This may give the skin a blotchy (mottled) appearance. °· Your skin turns blue or pale. °· Your skin becomes waxy or hard. °· Your swelling gets worse. °MAKE SURE YOU:  °· Understand these instructions. °· Will watch your condition. °· Will get help right away if you are not doing well or get worse. °Document Released: 11/08/2010 Document Revised: 07/29/2013 Document Reviewed: 11/08/2010 °[employer]® Patient Information ©2015 [employer]. This information is not intended to replace advice given to you by your health care provider. Make sure you discuss any questions you have with your health care provider. °Facial or Scalp Contusion °A facial or scalp contusion is a deep bruise on the face or head. Injuries to the face and head generally cause a lot of swelling, especially around the eyes. Contusions are the result of an injury that caused bleeding under the skin. The contusion may turn blue, purple, or yellow. Minor injuries will give you a painless contusion, but more severe contusions may stay painful and swollen for a few weeks.  °CAUSES  °A facial or scalp contusion is caused by a blunt injury or trauma to the face or head area.  °SIGNS AND SYMPTOMS  °· Swelling of the injured area.   °· Discoloration of the injured area.   °· Tenderness, soreness, or pain in the injured area.   °DIAGNOSIS  °The diagnosis can be made by taking a medical history and doing a physical exam. An X-ray exam, CT scan, or MRI may be needed to determine if there are any associated injuries, such as broken bones (fractures). °TREATMENT  °Often, the best treatment for a facial or scalp contusion is applying cold compresses to the injured area. Over-the-counter medicines may also be recommended for pain control.  °HOME CARE INSTRUCTIONS  °· Only take over-the-counter or prescription medicines as directed by your health care provider.   °· Apply ice to the injured area.   °· Put ice in a plastic  bag.   °· Place a towel between your skin and the bag.   °· Leave the ice on for 20 minutes, 2-3 times a day.   °SEEK MEDICAL CARE IF: °· You have bite problems.   °· You have pain with chewing.   °· You are concerned about facial defects. °SEEK IMMEDIATE MEDICAL CARE IF: °· You have severe pain or a headache that is not relieved by medicine.   °· You have unusual sleepiness, confusion, or personality changes.   °· You throw up (vomit).   °· You have a persistent nosebleed.   °· You have double vision or blurred vision.   °· You have fluid drainage from your nose or ear.   °· You have difficulty walking or using your arms or legs.   °MAKE SURE YOU:  °· Understand these instructions. °· Will watch your condition. °· Will get help right away if you are not doing well or get worse. °Document Released: 04/21/2004 Document Revised: 01/02/2013   Document Reviewed: 10/25/2012 °Exit[employer]® Patient Information ©2015 [employer]. This information is not intended to replace advice given to you by your health care provider. Make sure you discuss any questions you have with your health care provider. ° °

## 2014-02-28 ENCOUNTER — Encounter (HOSPITAL_COMMUNITY): Payer: Self-pay | Admitting: Psychiatry

## 2014-02-28 ENCOUNTER — Encounter (INDEPENDENT_AMBULATORY_CARE_PROVIDER_SITE_OTHER): Payer: Self-pay

## 2014-02-28 ENCOUNTER — Ambulatory Visit (INDEPENDENT_AMBULATORY_CARE_PROVIDER_SITE_OTHER): Payer: Federal, State, Local not specified - PPO | Admitting: Psychiatry

## 2014-02-28 VITALS — BP 130/80 | HR 60 | Ht 65.0 in | Wt 164.0 lb

## 2014-02-28 DIAGNOSIS — F331 Major depressive disorder, recurrent, moderate: Secondary | ICD-10-CM

## 2014-02-28 MED ORDER — ESCITALOPRAM OXALATE 10 MG PO TABS: 5.0000 mg | ORAL_TABLET | Freq: Every day | ORAL | Status: DC

## 2014-02-28 NOTE — Progress Notes (Signed)
Patient ID: Lindsey EvaMary A Shults, female   DOB: Jul 23, 1955, 58 y.o.   MRN: [id number]   [facility]  Lindsey Klein Jul 23, 1955 [id number] 58 y.o. 02/28/2014 3:08 PM  Chief Complaint: Follow up  History of Present Illness: HPI Comments: Lindsey Klein is  a 58 y/o female with a past psychiatric history significant for Major Depressive Disorder, recurrent, severe without psychotic features. The patient is referred for psychiatric services for medication management.    . Location: Patient reports she has been doing well and has been working.  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  . Quality: Injured her left arm in cast. Sleeps reasonable. Lives by herself. Some tremulousness was noticed by her allergist. For this reason we talked about cutting the level. Now she is on Wellbutrin 200 mg the tremulousness is still there although not as intense. According to her sons the family and one sister has it. She's been feeling somewhat a motivated and down but is not sure whether this may be related to medication lowerage.   In the area of affective symptoms, patient appears euthymic. Patient denies current suicidal ideation, intent, or plan. Patient denies current homicidal ideation, intent, or plan. Patient denies auditory hallucinations. Patient denies visual hallucinations. Patient denies symptoms of paranoia. Patient states sleep good, despite approximately 7-8 hours of sleep per night. Appetite is good. Energy level is fluctautes from good to poor. Patient endorses improvement symptoms of anhedonia. Patient denies hopelessness, helplessness, endorses some guilt.   . Severity: Depression:6/10 (0=Very depressed; 5=Neutral; 10=Very Happy)  Anxiety- 1/10 (0=no anxiety; 5= moderate/tolerable anxiety; 10= panic attacks)  . Duration: Started at age 58-no precipitating causes. Says it runs in family.  . Timing: No specific time of day of time of year. Will review how winter  effects her.   . Context: No specific context.  . Modifying factors: Improved with taking antidepressants. No clear manic or psychotic symptoms. We talked about cutting down on lamictal which she wants.  . Associated signs and symptoms: As noted in psychiatric ROS  Suicidal Ideation: Negative Plan Formed: Negative Patient has means to carry out plan: Negative  Homicidal Ideation: Negative Plan Formed: Negative Patient has means to carry out plan: Negative  Review of Systems: Review of Systems  Constitutional: Negative for fever.  HENT: Negative for hearing loss.   Respiratory: Negative for cough.   Cardiovascular: Negative for palpitations.  Gastrointestinal: Negative for nausea.  Skin: Negative for rash.  Neurological: Positive for tremors. Negative for dizziness, loss of consciousness and headaches.  Psychiatric/Behavioral: Positive for depression. Negative for suicidal ideas, hallucinations and substance abuse. The patient is nervous/anxious. The patient does not have insomnia.      Neurologic: Headache: Negative Seizure: Negative Paresthesias: Negative  Past Medical, Family, Social History:  Past Medical History  Diagnosis Date  . ASCUS (atypical squamous cells of undetermined significance) on Pap smear 03/2006    NEG FOR HISH RISK HPV  . Increased prolactin level 01/2007    WAS 37-- RECHECK WAS 28, 16 in 2011  . LGSIL (low grade squamous intraepithelial dysplasia) 07/2009    C&B, ECC NEGATIVE  . Depression   . Ovarian cyst, left 2014   Family History  Problem Relation Age of Onset  . Breast cancer Mother     metastized to liver  . Alcohol abuse Mother   . Depression Mother   . Breast cancer Father     testicular and then unknown later in life  . Bipolar disorder Sister  History   Social History  . Marital Status: Divorced    Spouse Name: N/A    Number of Children: N/A  . Years of Education: N/A   Social History Main Topics  . Smoking status:  Former Games developermoker  . Smokeless tobacco: Never Used     Comment: quit 28 years ago  . Alcohol Use: 0.6 oz/week    1 Glasses of wine per week     Comment: socially  . Drug Use: No  . Sexual Activity:    Partners: Male    Birth Control/ Protection: Post-menopausal   Other Topics Concern  . None   Social History Narrative   Outpatient Encounter Prescriptions as of 02/28/2014  Medication Sig  . buPROPion (WELLBUTRIN SR) 200 MG 12 hr tablet Take 1 tablet (200 mg total) by mouth daily.  Marland Kitchen. escitalopram (LEXAPRO) 10 MG tablet Take 0.5 tablets (5 mg total) by mouth daily.  Marland Kitchen. escitalopram (LEXAPRO) 20 MG tablet Take 1 tablet (20 mg total) by mouth daily.  Marland Kitchen. ibuprofen (ADVIL,MOTRIN) 200 MG tablet Take 400 mg by mouth every 6 (six) hours as needed for moderate pain.  Marland Kitchen. lamoTRIgine (LAMICTAL) 150 MG tablet Take 1 tablet (150 mg total) by mouth daily.  . mometasone-formoterol (DULERA) 100-5 MCG/ACT AERO Inhale 2 puffs into the lungs 2 (two) times daily.  . montelukast (SINGULAIR) 10 MG tablet Take 10 mg by mouth at bedtime.     Physical Exam: Constitutional:  BP 130/80 mmHg  Pulse 60  Ht 5\' 5"  (1.651 m)  Wt 164 lb (74.39 kg)  BMI 27.29 kg/m2  LMP 05/27/2010  General Appearance: alert, oriented, no acute distress, well nourished and obese Musculoskeletal: Gait & Station: normal Patient leans: N/A  Psychiatric: General Appearance: Casual and Well Groomed  Patent attorneyye Contact::  Good  Speech:  Clear and Coherent and Normal Rate  Volume:  Normal  Mood:  Somewhat dysphoric not hopeless.   Affect:  Appropriate, Congruent and Full Range  Thought Process:  Coherent, Linear and Logical  Orientation:  Full (Time, Place, and Person)  Thought Content:  WDL  Suicidal Thoughts:  No  Homicidal Thoughts:  No  Memory:  Immediate;   Good Recent;   Fair Remote;   Fair  Judgement:  Good  Insight:  Good  Psychomotor Activity:  Normal  Concentration:  Negative  Recall:  Negative  Akathisia:  Negative   Handed:  Right  AIMS (if indicated): Not indicated  Assets:  Communication Skills Desire for Improvement Financial Resources/Insurance Housing Intimacy Leisure Time Physical Health Resilience Social Support Talents/Skills Transportation Vocational/Educational  Sleep:  Number of Hours: 7-8     Medical Decision Making (Choose Three): Established Problem, Stable/Improving (1), Review of Psycho-Social Stressors (1) and Review of Medication Regimen & Side Effects (2)  Assessment: Axis I: Major depressive disorder, recurrent, moderate-stable   Plan:   Plan of Care:  PLAN:  1. Affirm with the patient that the medications are taken as ordered. Patient  expressed understanding of how their medications were to be used.    Laboratory:   No labs warranted at this time.   Psychotherapy: Therapy: brief supportive therapy provided.  Discussed psychosocial stressors in detail. More than 50% of the visit was spent on individual therapy/counseling.   Medications:  Continue the following psychiatric medications as written prior to this appointment with the following changes::  Increase Lexapro to 25 mg. She is to get 20 mg that she already has an half tablet of 10 mg. Continue Wellbutrin 200  mg. Continue Lamictal. Next visit we will talk was increasing the Lexapro if needed and further lowering the dose of Wellbutrin she will make an appointment neurologist and also assess her tremulousness  Follow up with other providers for any other medication contributing to tremors. Has been on claritin before. -Risks and benefits, side effects and alternatives discussed with patient, she was given an opportunity to ask questions about her medication, illness, and treatment. All current psychiatric medications have been reviewed and discussed with the patient and adjusted as clinically appropriate. The patient has been provided an accurate and updated list of the medications being now prescribed.   Routine  PRN Medications:  Negative  Consultations: The patient was encouraged to keep all PCP and specialty clinic appointments.   Safety Concerns:   Patient told to call clinic if any problems occur. Patient advised to go to  ER  if she should develop SI/HI, side effects, or if symptoms worsen. Has crisis numbers to call if needed.    Other:   8. Patient was instructed to return to clinic in 2 months.  9. The patient was advised to call and cancel their mental health appointment within 24 hours of the appointment, if they are unable to keep the appointment, as well as the three no show and termination from clinic policy. 10. The patient expressed understanding of the plan and agrees with the above. lamictal will be half dose at next visit.    Time spent: 25 minutes Thresa Ross, M.D.  02/28/2014 3:08 PM

## 2014-04-17 ENCOUNTER — Telehealth (HOSPITAL_COMMUNITY): Payer: Self-pay | Admitting: Psychiatry

## 2014-04-24 ENCOUNTER — Encounter (INDEPENDENT_AMBULATORY_CARE_PROVIDER_SITE_OTHER): Payer: Self-pay

## 2014-04-24 ENCOUNTER — Encounter (HOSPITAL_COMMUNITY): Payer: Self-pay | Admitting: Psychiatry

## 2014-04-24 ENCOUNTER — Ambulatory Visit (INDEPENDENT_AMBULATORY_CARE_PROVIDER_SITE_OTHER): Payer: Federal, State, Local not specified - PPO | Admitting: Psychiatry

## 2014-04-24 VITALS — HR 88 | Ht 65.0 in | Wt 165.0 lb

## 2014-04-24 DIAGNOSIS — F331 Major depressive disorder, recurrent, moderate: Secondary | ICD-10-CM

## 2014-04-24 MED ORDER — LAMOTRIGINE 150 MG PO TABS
150.0000 mg | ORAL_TABLET | Freq: Every day | ORAL | Status: DC
Start: 2014-04-24 — End: 2014-07-24

## 2014-04-24 MED ORDER — ESCITALOPRAM OXALATE 20 MG PO TABS: 20.0000 mg | ORAL_TABLET | Freq: Every day | ORAL | Status: DC

## 2014-04-24 MED ORDER — ESCITALOPRAM OXALATE 5 MG PO TABS: 5.0000 mg | ORAL_TABLET | Freq: Every day | ORAL | Status: DC

## 2014-04-24 NOTE — Progress Notes (Signed)
Patient ID: Lindsey Klein, female   DOB: 09/05/1955, 59 y.o.   MRN: [id number]   [facility]  Lindsey EvaMary A Mwangi 09/05/1955 [id number] 59 y.o. 04/24/2014 1:19 PM  Chief Complaint: Follow up  History of Present Illness: HPI Comments: [name] is  a 59 y/o female with a past psychiatric history significant for Major Depressive Disorder, recurrent, severe without psychotic features. The patient is referred for psychiatric services for medication management.    . Location: Patient reports she has been doing reasonable and somewhat better after 5mg  increase of lexapro last vist.  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  . Quality: Injured her left arm in cast. Sleeps reasonable. Lives by herself. She does snore and feels tired after awakening at times.  Some tremulousness was noticed by her allergist and wellbutrin was cut down to 200mg . She has not made appointment with neurology yet.  Some decrease concentration noted by patient.    In the area of affective symptoms, patient appears euthymic. Patient denies current suicidal ideation, intent, or plan. Patient denies current homicidal ideation, intent, or plan. Patient denies auditory hallucinations. Patient denies visual hallucinations. Patient denies symptoms of paranoia. Patient states sleep good, despite approximately 7-8 hours of sleep per night. Appetite is good. Energy level is fluctautes from good to poor. Patient endorses improvement symptoms of anhedonia. Patient denies hopelessness, helplessness, endorses some guilt.   . Severity: Depression:6/10 (0=Very depressed; 5=Neutral; 10=Very Happy)  Anxiety- 1/10 (0=no anxiety; 5= moderate/tolerable anxiety; 10= panic attacks)  . Duration: Started at age 59-no precipitating causes. Says it runs in family.  . Timing: No specific time of day of time of year.   . Context: No specific context.  . Modifying factors: Improved with taking  antidepressants. No clear manic or psychotic symptoms. We talked about cutting down on lamictal which she wants.  . Associated signs and symptoms: As noted in psychiatric ROS  Suicidal Ideation: Negative Plan Formed: Negative Patient has means to carry out plan: Negative  Homicidal Ideation: Negative Plan Formed: Negative Patient has means to carry out plan: Negative  Review of Systems: Review of Systems  Constitutional: Negative.   Neurological: Positive for tremors.  Psychiatric/Behavioral: Negative for suicidal ideas and substance abuse.     Neurologic: Headache: Negative Seizure: Negative Paresthesias: Negative  Past Medical, Family, Social History:  Past Medical History  Diagnosis Date  . ASCUS (atypical squamous cells of undetermined significance) on Pap smear 03/2006    NEG FOR HISH RISK HPV  . Increased prolactin level 01/2007    WAS 37-- RECHECK WAS 28, 16 in 2011  . LGSIL (low grade squamous intraepithelial dysplasia) 07/2009    C&B, ECC NEGATIVE  . Depression   . Ovarian cyst, left 2014   Family History  Problem Relation Age of Onset  . Breast cancer Mother     metastized to liver  . Alcohol abuse Mother   . Depression Mother   . Breast cancer Father     testicular and then unknown later in life  . Bipolar disorder Sister    History   Social History  . Marital Status: Divorced    Spouse Name: N/A    Number of Children: N/A  . Years of Education: N/A   Social History Main Topics  . Smoking status: Former Games developermoker  . Smokeless tobacco: Never Used     Comment: quit 28 years ago  . Alcohol Use: 0.6 oz/week    1 Glasses of wine per week  Comment: socially  . Drug Use: No  . Sexual Activity:    Partners: Male    Birth Control/ Protection: Post-menopausal   Other Topics Concern  . None   Social History Narrative   Outpatient Encounter Prescriptions as of 04/24/2014  Medication Sig  . buPROPion (WELLBUTRIN SR) 200 MG 12 hr tablet Take 1  tablet (200 mg total) by mouth daily.  Marland Kitchen escitalopram (LEXAPRO) 20 MG tablet Take 1 tablet (20 mg total) by mouth daily.  Marland Kitchen escitalopram (LEXAPRO) 5 MG tablet Take 1 tablet (5 mg total) by mouth daily.  Marland Kitchen ibuprofen (ADVIL,MOTRIN) 200 MG tablet Take 400 mg by mouth every 6 (six) hours as needed for moderate pain.  Marland Kitchen lamoTRIgine (LAMICTAL) 150 MG tablet Take 1 tablet (150 mg total) by mouth daily.  . mometasone-formoterol (DULERA) 100-5 MCG/ACT AERO Inhale 2 puffs into the lungs 2 (two) times daily.  . montelukast (SINGULAIR) 10 MG tablet Take 10 mg by mouth at bedtime.  . [DISCONTINUED] escitalopram (LEXAPRO) 10 MG tablet Take 0.5 tablets (5 mg total) by mouth daily.  . [DISCONTINUED] escitalopram (LEXAPRO) 20 MG tablet Take 1 tablet (20 mg total) by mouth daily.  . [DISCONTINUED] lamoTRIgine (LAMICTAL) 150 MG tablet Take 1 tablet (150 mg total) by mouth daily.     Physical Exam: Constitutional:  Pulse 88  Ht  (1.651 m)  Wt 165 lb (74.844 kg)  BMI 27.46 kg/m2  LMP 05/27/2010  General Appearance: alert, oriented, no acute distress, well nourished and obese Musculoskeletal: Gait & Station: normal Patient leans: N/A  Psychiatric: General Appearance: Casual and Well Groomed  Patent attorney::  Good  Speech:  Clear and Coherent and Normal Rate  Volume:  Normal  Mood:  Somewhat dysphoric not hopeless.   Affect:  Appropriate, Congruent and Full Range  Thought Process:  Coherent, Linear and Logical  Orientation:  Full (Time, Place, and Person)  Thought Content:  WDL  Suicidal Thoughts:  No  Homicidal Thoughts:  No  Memory:  Immediate;   Good Recent;   Fair Remote;   Fair  Judgement:  Good  Insight:  Good  Psychomotor Activity:  Normal  Concentration:  Negative  Recall:  Negative  Akathisia:  Negative  Handed:  Right  AIMS (if indicated): Not indicated  Assets:  Communication Skills Desire for Improvement Financial Resources/Insurance Housing Intimacy Leisure  Time Physical Health Resilience Social Support Talents/Skills Transportation Vocational/Educational  Sleep:  Number of Hours: 7-8     Medical Decision Making (Choose Three): Established Problem, Stable/Improving (1), Review of Psycho-Social Stressors (1) and Review of Medication Regimen & Side Effects (2)  Assessment: Axis I: Major depressive disorder, recurrent, moderate-stable   Plan:   Plan of Care:  PLAN:  1. Affirm with the patient that the medications are taken as ordered. Patient  expressed understanding of how their medications were to be used.    Laboratory:   No labs warranted at this time.   Psychotherapy: Therapy: brief supportive therapy provided.  Discussed psychosocial stressors in detail. More than 50% of the visit was spent on individual therapy/counseling.   Medications:  Continue the following psychiatric medications as written prior to this appointment with the following changes::  Continue lexapro .  Continue Wellbutrin 200 mg. Continue Lamictal. Next visit we will talk was increasing the Lexapro if needed and further lowering the dose of Wellbutrin she will make an appointment neurologist and also assess her tremulousness  3 months prescriptions written. No rash reported Follow up with other providers  for any other medication contributing to tremors. Has been on claritin before. -Risks and benefits, side effects and alternatives discussed with patient, she was given an opportunity to ask questions about her medication, illness, and treatment. All current psychiatric medications have been reviewed and discussed with the patient and adjusted as clinically appropriate. The patient has been provided an accurate and updated list of the medications being now prescribed.   Routine PRN Medications:  Negative  Consultations: The patient was encouraged to keep all PCP and specialty clinic appointments.  Encourage to follow with pcp and get referral for sleep study  to rule out OSA contributing to decrease concentraation.   Safety Concerns:   Patient told to call clinic if any problems occur. Patient advised to go to  ER  if she should develop SI/HI, side effects, or if symptoms worsen. Has crisis numbers to call if needed.    Other:   8. Patient was instructed to return to clinic in 2 months.  9. The patient was advised to call and cancel their mental health appointment within 24 hours of the appointment, if they are unable to keep the appointment, as well as the three no show and termination from clinic policy. 10. The patient expressed understanding of the plan and agrees with the above. lamictal will be half dose at next visit.     Thresa Ross, M.D.  04/24/2014 1:19 PM

## 2014-05-02 MED ORDER — BUPROPION HCL ER (SR) 200 MG PO TB12: 200.0000 mg | ORAL_TABLET | Freq: Every day | ORAL | Status: DC

## 2014-05-02 NOTE — Telephone Encounter (Signed)
buPROPion (WELLBUTRIN SR) 200 MG 12 hr tablet was not sent in on 04/24/14 with the other scripts please [facility] to [facility] [address] [place]

## 2014-05-02 NOTE — Telephone Encounter (Signed)
wellbutrin 200mg  90 sent to [facility] and [facility] [address] [place].

## 2014-05-22 ENCOUNTER — Other Ambulatory Visit (HOSPITAL_COMMUNITY): Payer: Self-pay | Admitting: Psychiatry

## 2014-05-22 ENCOUNTER — Telehealth (HOSPITAL_COMMUNITY): Payer: Self-pay | Admitting: *Deleted

## 2014-05-22 ENCOUNTER — Other Ambulatory Visit (HOSPITAL_COMMUNITY): Payer: Self-pay | Admitting: *Deleted

## 2014-05-22 MED ORDER — BUPROPION HCL ER (SR) 200 MG PO TB12: 200.0000 mg | ORAL_TABLET | Freq: Every day | ORAL | Status: DC

## 2014-05-22 NOTE — Telephone Encounter (Signed)
Spoke w/ Lindsey [facility] at [facility] [address] Per Dr. Gilmore LarocheAkhtar, pt is authorized for a 2 week supply of Wellbutrin 200mg , Qty 14 until mail order arrives to pt. Called and informed pt of prescription status. Pt verbally states and shows understanding.

## 2014-05-22 NOTE — Telephone Encounter (Signed)
Pt called for a refill for Wellbutrin 200mg  to [facility]. Per Dr. Gilmore LarocheAkhtar, pt is authorized for a 90 day supply refill for Wellbutrin 200mg , Qty 90.  Pt has a f/u appt on 07/24/14. Pt verbally states and shows understanding.

## 2014-05-23 ENCOUNTER — Other Ambulatory Visit (HOSPITAL_COMMUNITY): Payer: Self-pay | Admitting: *Deleted

## 2014-05-23 MED ORDER — BUPROPION HCL ER (SR) 200 MG PO TB12: 200.0000 mg | ORAL_TABLET | Freq: Every day | ORAL | Status: DC

## 2014-05-23 NOTE — Telephone Encounter (Signed)
Sp[facility] w/ All[facility], pt will need a new Rx for a 90 day supply for pt insurance to pay for bupropion (WELLBUTRIN SR) 200 MG 12 hr tablet.  Per Dr. Gilmore LarocheAkhtar, pt is authorized for a refill for bupropion (WELLBUTRIN SR) 200 MG 12 hr tablet, Qty 90.  Pharmacy will notify pt once prescription in ready. Pt has f/u appt on 4/28.

## 2014-05-23 NOTE — Telephone Encounter (Signed)
Received call from pt stating pt medication was rejected by pharmacy.  Number called: [phone] Spoke w/ Maisie Fushomas from [facility]. Per Maisie Fushomas, Pt will allow a lost/ override. [facility] [address] for a 90 day supply. Pharmacy will notify pt.

## 2014-07-24 ENCOUNTER — Encounter (INDEPENDENT_AMBULATORY_CARE_PROVIDER_SITE_OTHER): Payer: Self-pay

## 2014-07-24 ENCOUNTER — Ambulatory Visit (INDEPENDENT_AMBULATORY_CARE_PROVIDER_SITE_OTHER): Payer: Federal, State, Local not specified - PPO | Admitting: Psychiatry

## 2014-07-24 ENCOUNTER — Encounter (HOSPITAL_COMMUNITY): Payer: Self-pay | Admitting: Psychiatry

## 2014-07-24 VITALS — BP 126/66 | HR 90 | Ht 65.0 in | Wt 167.0 lb

## 2014-07-24 DIAGNOSIS — F331 Major depressive disorder, recurrent, moderate: Secondary | ICD-10-CM | POA: Diagnosis not present

## 2014-07-24 MED ORDER — ESCITALOPRAM OXALATE 20 MG PO TABS
30.0000 mg | ORAL_TABLET | Freq: Every day | ORAL | Status: DC
Start: 2014-07-24 — End: 2014-10-23

## 2014-07-24 MED ORDER — LAMOTRIGINE 150 MG PO TABS
150.0000 mg | ORAL_TABLET | Freq: Every day | ORAL | Status: DC
Start: 2014-07-24 — End: 2014-10-23

## 2014-07-24 NOTE — Progress Notes (Signed)
Patient ID: Lindsey Klein, female   DOB: 07/31/1955, 59 y.o.   MRN: [id number]   [facility] Follow-up Outpatient Visit  Lindsey Klein 08/30/1955 [id number] 59 y.o. 07/24/2014 1:12 PM  Chief Complaint: Follow up  History of Present Illness: HPI Comments: Lindsey Klein is  a 59 y/o female with a past psychiatric history significant for Major Depressive Disorder, recurrent, severe without psychotic features. The patient is referred for psychiatric services for medication management.    . Location: Patient reports she is taking a lower dose of Wellbutrin but because of some depression and anxiety she had to increase her Lexapro now she is taking 30 mg. There is no significant decrease in tremulousness or tremors by cutting down the dose of Wellbutrin. She states that she's always had it. She still will follow up with a neurologist. 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 Panic to retire next year . Quality:Sleeps reasonable. Lives by herself. Mood wise is described to be baseline or not hopeless or got any worse  In the area of affective symptoms, patient appears euthymic. Patient denies current suicidal ideation, intent, or plan. Patient denies current homicidal ideation, intent, or plan. Patient denies auditory hallucinations. Patient denies visual hallucinations. Patient denies symptoms of paranoia. Patient states sleep good, despite approximately 7-8 hours of sleep per night. Appetite is good. Energy level is fluctautes from good to poor. Patient endorses improvement symptoms of anhedonia. Patient denies hopelessness, helplessness, endorses some guilt.   . Severity: Depression:6/10 (0=Very depressed; 5=Neutral; 10=Very Happy)  Anxiety- 2/10 (0=no anxiety; 5= moderate/tolerable anxiety; 10= panic attacks)  . Duration: Started at age 18-no precipitating causes. Says it runs in family.  . Timing: No specific time of day of time of year.   .  Context: No specific context.  . Modifying factors: Improved with taking antidepressants. No clear manic or psychotic symptoms. We talked about cutting down on lamictal which she wants.  . Associated signs and symptoms: As noted in psychiatric ROS  Suicidal Ideation: Negative Plan Formed: Negative Patient has means to carry out plan: Negative  Homicidal Ideation: Negative Plan Formed: Negative Patient has means to carry out plan: Negative  Review of Systems: ROS   Neurologic: Headache: Negative Seizure: Negative Paresthesias: Negative  Past Medical, Family, Social History:  Past Medical History  Diagnosis Date  . ASCUS (atypical squamous cells of undetermined significance) on Pap smear 03/2006    NEG FOR HISH RISK HPV  . Increased prolactin level 01/2007    WAS 37-- RECHECK WAS 28, 16 in 2011  . LGSIL (low grade squamous intraepithelial dysplasia) 07/2009    C&B, ECC NEGATIVE  . Depression   . Ovarian cyst, left 2014   Family History  Problem Relation Age of Onset  . Breast cancer Mother     metastized to liver  . Alcohol abuse Mother   . Depression Mother   . Breast cancer Father     testicular and then unknown later in life  . Bipolar disorder Sister    History   Social History  . Marital Status: Divorced    Spouse Name: N/A  . Number of Children: N/A  . Years of Education: N/A   Social History Main Topics  . Smoking status: Former Games developer  . Smokeless tobacco: Never Used     Comment: quit 28 years ago  . Alcohol Use: 0.6 oz/week    1 Glasses of wine per week     Comment: socially  .  Drug Use: No  . Sexual Activity:    Partners: Male    Birth Control/ Protection: Post-menopausal   Other Topics Concern  . None   Social History Narrative   Outpatient Encounter Prescriptions as of 07/24/2014  Medication Sig  . buPROPion (WELLBUTRIN SR) 200 MG 12 hr tablet Take 1 tablet (200 mg total) by mouth daily.  Marland Kitchen escitalopram (LEXAPRO) 20 MG tablet Take 1.5  tablets (30 mg total) by mouth daily.  Marland Kitchen escitalopram (LEXAPRO) 5 MG tablet Take 1 tablet (5 mg total) by mouth daily.  Marland Kitchen ibuprofen (ADVIL,MOTRIN) 200 MG tablet Take 400 mg by mouth every 6 (six) hours as needed for moderate pain.  Marland Kitchen lamoTRIgine (LAMICTAL) 150 MG tablet Take 1 tablet (150 mg total) by mouth daily.  . mometasone-formoterol (DULERA) 100-5 MCG/ACT AERO Inhale 2 puffs into the lungs 2 (two) times daily.  . [DISCONTINUED] escitalopram (LEXAPRO) 20 MG tablet Take 1 tablet (20 mg total) by mouth daily.  . [DISCONTINUED] lamoTRIgine (LAMICTAL) 150 MG tablet Take 1 tablet (150 mg total) by mouth daily.  . [DISCONTINUED] montelukast (SINGULAIR) 10 MG tablet Take 10 mg by mouth at bedtime.     Physical Exam: Constitutional:  BP 126/66 mmHg  Pulse 90  Ht  (1.651 m)  Wt 167 lb (75.751 kg)  BMI 27.79 kg/m2  LMP 05/27/2010  General Appearance: alert, oriented, no acute distress, well nourished and obese Musculoskeletal: Gait & Station: normal Patient leans: N/A  Psychiatric: General Appearance: Casual and Well Groomed  Patent attorney::  Good  Speech:  Clear and Coherent and Normal Rate  Volume:  Normal  Mood:  euthymic   Affect:  Appropriate, Congruent and Full Range  Thought Process:  Coherent, Linear and Logical  Orientation:  Full (Time, Place, and Person)  Thought Content:  WDL  Suicidal Thoughts:  No  Homicidal Thoughts:  No  Memory:  Immediate;   Good Recent;   Fair Remote;   Fair  Judgement:  Good  Insight:  Good  Psychomotor Activity:  Normal  Concentration:  Negative  Recall:  Negative  Akathisia:  Negative  Handed:  Right  AIMS (if indicated): Not indicated  Assets:  Communication Skills Desire for Improvement Financial Resources/Insurance Housing Intimacy Leisure Time Physical Health Resilience Social Support Talents/Skills Transportation Vocational/Educational  Sleep:  Number of Hours: 7-8     Medical Decision Making (Choose Three):  Established Problem, Stable/Improving (1), Review of Psycho-Social Stressors (1) and Review of Medication Regimen & Side Effects (2)  Assessment: Axis I: Major depressive disorder, recurrent, moderate-stable   Plan:   Plan of Care:  PLAN:  1. Affirm with the patient that the medications are taken as ordered. Patient  expressed understanding of how their medications were to be used.    Laboratory:   No labs warranted at this time.   Psychotherapy: Therapy: brief supportive therapy provided.  Discussed psychosocial stressors in detail. More than 50% of the visit was spent on individual therapy/counseling.   Medications:  Continue the following psychiatric medications as written prior to this appointment with the following changes::  Change Lexapro to 20 mg 1-1/2 tablet that is total of 30 mg. Continue Wellbutrin 200 mg she has refill. Renew lamictal. Mail order sent 3 months prescriptions written. No rash reported Follow up with other providers for any other medication contributing to tremors. Has been on claritin before. -Risks and benefits, side effects and alternatives discussed with patient, she was given an opportunity to ask questions about her medication, illness,  and treatment. All current psychiatric medications have been reviewed and discussed with the patient and adjusted as clinically appropriate. The patient has been provided an accurate and updated list of the medications being now prescribed.   Routine PRN Medications:  Negative  Consultations: The patient was encouraged to keep all PCP and specialty clinic appointments.  Encourage to follow with pcp and get referral for sleep study to rule out OSA contributing to decrease concentraation.   Safety Concerns:   Patient told to call clinic if any problems occur. Patient advised to go to  ER  if she should develop SI/HI, side effects, or if symptoms worsen. Has crisis numbers to call if needed.    Other:   8. Patient was  instructed to return to clinic in 3 months.  9. The patient was advised to call and cancel their mental health appointment within 24 hours of the appointment, if they are unable to keep the appointment, as well as the three no show and termination from clinic policy. 10. The patient expressed understanding of the plan and agrees with the above. lamictal will be half dose at next visit.     Thresa RossNADEEM Mohammad Granade, M.D.  07/24/2014 1:12 PM

## 2014-08-15 ENCOUNTER — Other Ambulatory Visit (HOSPITAL_COMMUNITY): Payer: Self-pay | Admitting: Psychiatry

## 2014-08-15 NOTE — Telephone Encounter (Signed)
[facility] for a refill for Wellbutrin SR 200mg . Per Dr. Gilmore LarocheAkhtar, pt is authorized for a  Refill for Wellbutrin SR 200mg , Qty 90.  Prescription was sent into pharmacy. Pt has a f/u appt on 7/28. Called and informed pt of prescription status.  Pt states and shows understanding.

## 2014-09-11 ENCOUNTER — Telehealth (HOSPITAL_COMMUNITY): Payer: Self-pay | Admitting: *Deleted

## 2014-09-11 MED ORDER — BUPROPION HCL ER (SR) 200 MG PO TB12: 200.0000 mg | ORAL_TABLET | Freq: Every day | ORAL | Status: DC

## 2014-09-11 NOTE — Telephone Encounter (Signed)
Pt called for a refill for Wellbutrin 200mg . Per Dr. Gilmore Laroche, pt is authorized for a refill for Wellbutrin 200mg , Qty 30. [facility] Pt has a f/u appt on 7/28. Pt is aware of prescription status. Pt verbalizes understanding.

## 2014-10-10 ENCOUNTER — Other Ambulatory Visit (HOSPITAL_COMMUNITY): Payer: Self-pay | Admitting: Psychiatry

## 2014-10-13 NOTE — Telephone Encounter (Signed)
Pt called for a refill for Wellbutrin 200mg . Per Dr. Gilmore LarocheAkhtar, pt is authorized is Wellbutrin 200mg , Qty 30. Prescription was sent to pharmacy. Pt has a f/u appt 7/28. Called and informed pt of prescription status. Pt states and shows understanding.

## 2014-10-23 ENCOUNTER — Ambulatory Visit (INDEPENDENT_AMBULATORY_CARE_PROVIDER_SITE_OTHER): Payer: Federal, State, Local not specified - PPO | Admitting: Psychiatry

## 2014-10-23 ENCOUNTER — Encounter (HOSPITAL_COMMUNITY): Payer: Self-pay | Admitting: Psychiatry

## 2014-10-23 VITALS — BP 118/64 | HR 85 | Ht 65.0 in | Wt 171.0 lb

## 2014-10-23 DIAGNOSIS — F331 Major depressive disorder, recurrent, moderate: Secondary | ICD-10-CM | POA: Diagnosis not present

## 2014-10-23 DIAGNOSIS — F410 Panic disorder [episodic paroxysmal anxiety] without agoraphobia: Secondary | ICD-10-CM

## 2014-10-23 DIAGNOSIS — F411 Generalized anxiety disorder: Secondary | ICD-10-CM

## 2014-10-23 MED ORDER — ESCITALOPRAM OXALATE 20 MG PO TABS: 30.0000 mg | ORAL_TABLET | Freq: Every day | ORAL | Status: DC

## 2014-10-23 MED ORDER — BUPROPION HCL ER (SR) 200 MG PO TB12
ORAL_TABLET | ORAL | Status: DC
Start: 2014-10-23 — End: 2015-02-02

## 2014-10-23 MED ORDER — BUPROPION HCL ER (SR) 200 MG PO TB12: ORAL_TABLET | ORAL | Status: DC

## 2014-10-23 MED ORDER — LAMOTRIGINE 150 MG PO TABS
150.0000 mg | ORAL_TABLET | Freq: Every day | ORAL | Status: DC
Start: 2014-10-23 — End: 2015-02-02

## 2014-10-23 NOTE — Progress Notes (Signed)
Patient ID: Lindsey Klein, female   DOB: 09-Mar-1956, 59 y.o.   MRN: [id number]   [facility] Follow-up Outpatient Visit  Lindsey Klein 1955-10-04 [id number] 59 y.o. 10/23/2014 4:12 PM  Chief Complaint: Follow up  History of Present Illness: HPI Comments: Lindsey Klein is  a 59 y/o female with a past psychiatric history significant for Major Depressive Disorder, recurrent, severe without psychotic features, GAD and history of Panic disorder.  The patient is referred for psychiatric services for medication management.    . Location: Patient reports she is doing reasonable. Last visit we increased lexapro to 30mg  that has helped depression and anxiety.  Panic symptoms are infrequent. No side effects or rash. Lives by herself and not many responsibilies. occassional sleep disturbance.  In the area of affective symptoms, patient appears euthymic. Patient denies current suicidal ideation, intent, or plan. Patient denies current homicidal ideation, intent, or plan. Patient denies auditory hallucinations. Patient denies visual hallucinations. Patient denies symptoms of paranoia. Patient states sleep good, despite approximately 7-8 hours of sleep per night. Appetite is good. Energy level is fluctautes from good to poor. Patient endorses improvement symptoms of anhedonia. Patient denies hopelessness, helplessness, endorses some guilt.   . Severity: Depression:6/10 (0=Very depressed; 5=Neutral; 10=Very Happy)  Anxiety- 2/10 (0=no anxiety; 5= moderate/tolerable anxiety; 10= panic attacks)  . Duration: Started at age 57-no precipitating causes. Says it runs in family.  . Timing: No specific time of day of time of year.   . Context: No specific context.  . Modifying factors: Improved with taking antidepressants. No clear manic or psychotic symptoms. We talked about cutting down on lamictal which she wants.  . Associated signs and symptoms: As noted in psychiatric ROS  Suicidal  Ideation: Negative Plan Formed: Negative Patient has means to carry out plan: Negative  Homicidal Ideation: Negative Plan Formed: Negative Patient has means to carry out plan: Negative  Review of Systems: Review of Systems  Constitutional: Negative.   Skin: Negative for rash.  Psychiatric/Behavioral: Negative for depression and substance abuse.     Neurologic: Headache: Negative Seizure: Negative Paresthesias: Negative  Past Medical, Family, Social History:  Past Medical History  Diagnosis Date  . ASCUS (atypical squamous cells of undetermined significance) on Pap smear 03/2006    NEG FOR HISH RISK HPV  . Increased prolactin level 01/2007    WAS 37-- RECHECK WAS 28, 16 in 2011  . LGSIL (low grade squamous intraepithelial dysplasia) 07/2009    C&B, ECC NEGATIVE  . Depression   . Ovarian cyst, left 2014   Family History  Problem Relation Age of Onset  . Breast cancer Mother     metastized to liver  . Alcohol abuse Mother   . Depression Mother   . Breast cancer Father     testicular and then unknown later in life  . Bipolar disorder Sister    History   Social History  . Marital Status: Divorced    Spouse Name: N/A  . Number of Children: N/A  . Years of Education: N/A   Social History Main Topics  . Smoking status: Former Games developer  . Smokeless tobacco: Never Used     Comment: quit 28 years ago  . Alcohol Use: 0.6 oz/week    1 Glasses of wine per week     Comment: socially  . Drug Use: No  . Sexual Activity:    Partners: Male    Birth Control/ Protection: Post-menopausal   Other Topics Concern  . None  Social History Narrative   Outpatient Encounter Prescriptions as of 10/23/2014  Medication Sig  . buPROPion (WELLBUTRIN SR) 200 MG 12 hr tablet TAKE 1 TABLET (200 MG TOTAL) BY MOUTH DAILY.  Marland Kitchen escitalopram (LEXAPRO) 20 MG tablet Take 1.5 tablets (30 mg total) by mouth daily.  Marland Kitchen escitalopram (LEXAPRO) 5 MG tablet Take 1 tablet (5 mg total) by mouth daily.  Marland Kitchen  lamoTRIgine (LAMICTAL) 150 MG tablet Take 1 tablet (150 mg total) by mouth daily.  . mometasone-formoterol (DULERA) 100-5 MCG/ACT AERO Inhale 2 puffs into the lungs 2 (two) times daily.  . [DISCONTINUED] buPROPion (WELLBUTRIN SR) 200 MG 12 hr tablet TAKE 1 TABLET (200 MG TOTAL) BY MOUTH DAILY.  . [DISCONTINUED] buPROPion (WELLBUTRIN SR) 200 MG 12 hr tablet TAKE 1 TABLET (200 MG TOTAL) BY MOUTH DAILY.  . [DISCONTINUED] escitalopram (LEXAPRO) 20 MG tablet Take 1.5 tablets (30 mg total) by mouth daily.  . [DISCONTINUED] lamoTRIgine (LAMICTAL) 150 MG tablet Take 1 tablet (150 mg total) by mouth daily.  Marland Kitchen ibuprofen (ADVIL,MOTRIN) 200 MG tablet Take 400 mg by mouth every 6 (six) hours as needed for moderate pain.   No facility-administered encounter medications on file as of 10/23/2014.     Physical Exam: Constitutional:  BP 118/64 mmHg  Pulse 85  Ht  (1.651 m)  Wt 171 lb (77.565 kg)  BMI 28.46 kg/m2  SpO2 96%  LMP 05/27/2010  General Appearance: alert, oriented, no acute distress, well nourished and obese Musculoskeletal: Gait & Station: normal Patient leans: N/A  Psychiatric: General Appearance: Casual and Well Groomed  Patent attorney::  Good  Speech:  Clear and Coherent and Normal Rate  Volume:  Normal  Mood:  euthymic   Affect:  Appropriate, Congruent and Full Range  Thought Process:  Coherent, Linear and Logical  Orientation:  Full (Time, Place, and Person)  Thought Content:  WDL  Suicidal Thoughts:  No  Homicidal Thoughts:  No  Memory:  Immediate;   Good Recent;   Fair Remote;   Fair  Judgement:  Good  Insight:  Good  Psychomotor Activity:  Normal  Concentration:  Negative  Recall:  Negative  Akathisia:  Negative  Handed:  Right  AIMS (if indicated): Not indicated  Assets:  Communication Skills Desire for Improvement Financial Resources/Insurance Housing Intimacy Leisure Time Physical Health Resilience Social  Support Talents/Skills Transportation Vocational/Educational  Sleep:  Number of Hours: 7-8     Medical Decision Making (Choose Three): Established Problem, Stable/Improving (1), Review of Psycho-Social Stressors (1) and Review of Medication Regimen & Side Effects (2)  Assessment: Axis I: Major depressive disorder, recurrent, moderate-stable. GAD . Panic disorder per history   Plan:   Plan of Care:  PLAN:  1. Affirm with the patient that the medications are taken as ordered. Patient  expressed understanding of how their medications were to be used.    Laboratory:   No labs warranted at this time.   Psychotherapy: Therapy: brief supportive therapy provided.  Discussed psychosocial stressors in detail. More than 50% of the visit was spent on individual therapy/counseling.   Medications:  Continue the following psychiatric medications as written prior to this appointment with the following changes::  Continue lamictal  for depression, anxiety and panic Continue Wellbutrin 200 for depression lamictal for mood symptoms.  3 months prescriptions written.  Follow up with other providers for any other medication contributing to tremors. Has been on claritin before. -Risks and benefits, side effects and alternatives discussed with patient, she was given an opportunity  to ask questions about her medication, illness, and treatment. All current psychiatric medications have been reviewed and discussed with the patient and adjusted as clinically appropriate. The patient has been provided an accurate and updated list of the medications being now prescribed.   Routine PRN Medications:  Negative  Consultations: The patient was encouraged to keep all PCP and specialty clinic appointments.  Encourage to follow with pcp and get referral for sleep study to rule out OSA contributing to decrease concentraation.   Safety Concerns:   Patient told to call clinic if any problems occur. Patient advised to  go to  ER  if she should develop SI/HI, side effects, or if symptoms worsen. Has crisis numbers to call if needed.    Other:   8. Patient was instructed to return to clinic in 3 months.  9. The patient was advised to call and cancel their mental health appointment within 24 hours of the appointment, if they are unable to keep the appointment, as well as the three no show and termination from clinic policy. 10. The patient expressed understanding of the plan and agrees with the above. lamictal will be half dose at next visit.     Thresa Ross, M.D.  10/23/2014 4:12 PM

## 2015-01-22 ENCOUNTER — Ambulatory Visit (HOSPITAL_COMMUNITY): Payer: Self-pay | Admitting: Psychiatry

## 2015-02-02 ENCOUNTER — Ambulatory Visit (INDEPENDENT_AMBULATORY_CARE_PROVIDER_SITE_OTHER): Payer: Federal, State, Local not specified - PPO | Admitting: Psychiatry

## 2015-02-02 ENCOUNTER — Encounter (HOSPITAL_COMMUNITY): Payer: Self-pay | Admitting: Psychiatry

## 2015-02-02 DIAGNOSIS — F331 Major depressive disorder, recurrent, moderate: Secondary | ICD-10-CM | POA: Diagnosis not present

## 2015-02-02 DIAGNOSIS — F410 Panic disorder [episodic paroxysmal anxiety] without agoraphobia: Secondary | ICD-10-CM | POA: Diagnosis not present

## 2015-02-02 DIAGNOSIS — F411 Generalized anxiety disorder: Secondary | ICD-10-CM

## 2015-02-02 MED ORDER — ESCITALOPRAM OXALATE 20 MG PO TABS: 30.0000 mg | ORAL_TABLET | Freq: Every day | ORAL | Status: DC

## 2015-02-02 MED ORDER — BUPROPION HCL ER (SR) 200 MG PO TB12: ORAL_TABLET | ORAL | Status: DC

## 2015-02-02 MED ORDER — LAMOTRIGINE 150 MG PO TABS: 150.0000 mg | ORAL_TABLET | Freq: Every day | ORAL | Status: DC

## 2015-02-02 NOTE — Progress Notes (Signed)
Patient ID: Lindsey Klein, female   DOB: [birth year], 59 y.o.   MRN: [id number]   [facility] Follow-up Outpatient Visit  Lindsey Klein 1955-10-24 [id number] 59 y.o. 02/02/2015 1:09 PM  Chief Complaint: Follow up  History of Present Illness: HPI Comments: Lindsey Klein is  a 59 y/o female with a past psychiatric history significant for Major Depressive Disorder, recurrent, severe without psychotic features, GAD and history of Panic disorder.  The patient is referred for psychiatric services for medication management.    . Location: Patient reports baseline. Currently on lexapro  that has helped depression and anxiety.  Panic symptoms are infrequent. No side effects or rash. Lives by herself and not many responsibilies. occassional sleep disturbance.  In the area of affective symptoms, patient appears euthymic. Patient denies current suicidal ideation, intent, or plan. Patient denies current homicidal ideation, intent, or plan. Patient denies auditory hallucinations. Patient denies visual hallucinations. Patient denies symptoms of paranoia. Patient states sleep good, despite approximately 7-8 hours of sleep per night. Appetite is good. Energy level is fluctautes from good to poor. Patient endorses improvement symptoms of anhedonia. Patient denies hopelessness, helplessness, endorses some guilt.   . Severity: Depression:7/10 (0=Very depressed; 5=Neutral; 10=Very Happy)  Anxiety- 2/10 (0=no anxiety; 5= moderate/tolerable anxiety; 10= panic attacks)  . Duration: Started at age 72-no precipitating causes. Says it runs in family.  . Timing: No specific time of day of time of year.   . Context: No specific context.  . Modifying factors: Improved with taking antidepressants. No clear manic or psychotic symptoms. We talked about cutting down on lamictal which she wants.  . Associated signs and symptoms: As noted in psychiatric ROS  Suicidal Ideation: Negative Plan Formed:  Negative Patient has means to carry out plan: Negative  Homicidal Ideation: Negative Plan Formed: Negative Patient has means to carry out plan: Negative  Review of Systems: Review of Systems  Constitutional: Negative.   Cardiovascular: Negative for chest pain.  Skin: Negative for rash.  Psychiatric/Behavioral: Negative for depression, suicidal ideas and substance abuse.     Neurologic: Headache: Negative Seizure: Negative Paresthesias: Negative  Past Medical, Family, Social History:  Past Medical History  Diagnosis Date  . ASCUS (atypical squamous cells of undetermined significance) on Pap smear 03/2006    NEG FOR HISH RISK HPV  . Increased prolactin level (HCC) 01/2007    WAS 37-- RECHECK WAS 28, 16 in 2011  . LGSIL (low grade squamous intraepithelial dysplasia) 07/2009    C&B, ECC NEGATIVE  . Depression   . Ovarian cyst, left 2014   Family History  Problem Relation Age of Onset  . Breast cancer Mother     metastized to liver  . Alcohol abuse Mother   . Depression Mother   . Breast cancer Father     testicular and then unknown later in life  . Bipolar disorder Sister    Social History   Social History  . Marital Status: Divorced    Spouse Name: N/A  . Number of Children: N/A  . Years of Education: N/A   Social History Main Topics  . Smoking status: Former Games developer  . Smokeless tobacco: Never Used     Comment: quit 28 years ago  . Alcohol Use: 0.6 oz/week    1 Glasses of wine per week     Comment: socially  . Drug Use: No  . Sexual Activity:    Partners: Male    Birth Control/ Protection: Post-menopausal   Other Topics Concern  .  None   Social History Narrative   Outpatient Encounter Prescriptions as of 02/02/2015  Medication Sig  . buPROPion (WELLBUTRIN SR) 200 MG 12 hr tablet TAKE 1 TABLET (200 MG TOTAL) BY MOUTH DAILY.  Marland Kitchen. escitalopram (LEXAPRO) 20 MG tablet Take 1.5 tablets (30 mg total) by mouth daily.  Marland Kitchen. escitalopram (LEXAPRO) 5 MG tablet Take 1  tablet (5 mg total) by mouth daily.  Marland Kitchen. ibuprofen (ADVIL,MOTRIN) 200 MG tablet Take 400 mg by mouth every 6 (six) hours as needed for moderate pain.  Marland Kitchen. lamoTRIgine (LAMICTAL) 150 MG tablet Take 1 tablet (150 mg total) by mouth daily.  . mometasone-formoterol (DULERA) 100-5 MCG/ACT AERO Inhale 2 puffs into the lungs 2 (two) times daily.  . [DISCONTINUED] buPROPion (WELLBUTRIN SR) 200 MG 12 hr tablet TAKE 1 TABLET (200 MG TOTAL) BY MOUTH DAILY.  . [DISCONTINUED] escitalopram (LEXAPRO) 20 MG tablet Take 1.5 tablets (30 mg total) by mouth daily.  . [DISCONTINUED] lamoTRIgine (LAMICTAL) 150 MG tablet Take 1 tablet (150 mg total) by mouth daily.   No facility-administered encounter medications on file as of 02/02/2015.     Physical Exam: Constitutional:  LMP 05/27/2010  General Appearance: alert, oriented, no acute distress, well nourished and obese Musculoskeletal: Gait & Station: normal Patient leans: N/A  Psychiatric: General Appearance: Casual and Well Groomed  Patent attorneyye Contact::  Good  Speech:  Clear and Coherent and Normal Rate  Volume:  Normal  Mood:  euthymic   Affect:  Appropriate, Congruent and Full Range  Thought Process:  Coherent, Linear and Logical  Orientation:  Full (Time, Place, and Person)  Thought Content:  WDL  Suicidal Thoughts:  No  Homicidal Thoughts:  No  Memory:  Immediate;   Good Recent;   Fair Remote;   Fair  Judgement:  Good  Insight:  Good  Psychomotor Activity:  Normal  Concentration:  Negative  Recall:  Negative  Akathisia:  Negative  Handed:  Right  AIMS (if indicated): Not indicated  Assets:  Communication Skills Desire for Improvement Financial Resources/Insurance Housing Intimacy Leisure Time Physical Health Resilience Social Support Talents/Skills Transportation Vocational/Educational  Sleep:  Number of Hours: 7-8     Medical Decision Making (Choose Three): Established Problem, Stable/Improving (1), Review of Psycho-Social Stressors  (1) and Review of Medication Regimen & Side Effects (2)  Assessment: Axis I: Major depressive disorder, recurrent, moderate-stable. GAD . Panic disorder per history   Plan:   Plan of Care:  PLAN:  1. Affirm with the patient that the medications are taken as ordered. Patient  expressed understanding of how their medications were to be used.    Laboratory:   No labs warranted at this time.   Psychotherapy: Therapy: brief supportive therapy provided.  Discussed psychosocial stressors in detail. More than 50% of the visit was spent on individual therapy/counseling.   Medications:  Continue the following psychiatric medications as written prior to this appointment with the following changes::  Continue lamictal 30mg  for depression, anxiety and panic Continue Wellbutrin 200 for depression lamictal for mood symptoms.  3 months prescriptions written.  She wanted to cut down the Lamictal,  we'll wait till springtime. Follow up with other providers for any other medication contributing to tremors. Has been on claritin before. -Risks and benefits, side effects and alternatives discussed with patient, she was given an opportunity to ask questions about her medication, illness, and treatment. All current psychiatric medications have been reviewed and discussed with the patient and adjusted as clinically appropriate. The patient has been provided  an accurate and updated list of the medications being now prescribed.   Routine PRN Medications:  Negative  Consultations: The patient was encouraged to keep all PCP and specialty clinic appointments.  Encourage to follow with pcp and get referral for sleep study to rule out OSA contributing to decrease concentraation.   Safety Concerns:   Patient told to call clinic if any problems occur. Patient advised to go to  ER  if she should develop SI/HI, side effects, or if symptoms worsen. Has crisis numbers to call if needed.    Other:   8. Patient was  instructed to return to clinic in 3 months.  9. The patient was advised to call and cancel their mental health appointment within 24 hours of the appointment, if they are unable to keep the appointment, as well as the three no show and termination from clinic policy. 10. The patient expressed understanding of the plan and agrees with the above. lamictal will be half dose at next visit.     Thresa Ross, M.D.  02/02/2015 1:09 PM

## 2015-05-04 ENCOUNTER — Ambulatory Visit (HOSPITAL_COMMUNITY): Payer: Federal, State, Local not specified - PPO | Admitting: Psychiatry

## 2015-05-12 ENCOUNTER — Ambulatory Visit (HOSPITAL_COMMUNITY): Payer: Self-pay | Admitting: Psychiatry

## 2015-05-15 ENCOUNTER — Ambulatory Visit (INDEPENDENT_AMBULATORY_CARE_PROVIDER_SITE_OTHER): Payer: Federal, State, Local not specified - PPO | Admitting: Psychiatry

## 2015-05-15 ENCOUNTER — Encounter (HOSPITAL_COMMUNITY): Payer: Self-pay | Admitting: Psychiatry

## 2015-05-15 VITALS — BP 124/70 | HR 63 | Ht 65.0 in | Wt 176.0 lb

## 2015-05-15 DIAGNOSIS — F410 Panic disorder [episodic paroxysmal anxiety] without agoraphobia: Secondary | ICD-10-CM | POA: Diagnosis not present

## 2015-05-15 DIAGNOSIS — F411 Generalized anxiety disorder: Secondary | ICD-10-CM | POA: Diagnosis not present

## 2015-05-15 DIAGNOSIS — F331 Major depressive disorder, recurrent, moderate: Secondary | ICD-10-CM

## 2015-05-15 NOTE — Progress Notes (Signed)
Patient ID: Lindsey Klein, female   DOB: [birth year], 60 y.o.   MRN: [id number]   [facility] Follow-up Outpatient Visit  Lindsey Klein Jan 29, 1956 [id number] 60 y.o. 05/15/2015 1:07 PM  Chief Complaint: Follow up  History of Present Illness: HPI Comments: Ms. Inlow is  a 60 y/o female with a past psychiatric history significant for Major Depressive Disorder, recurrent, severe without psychotic features, GAD and history of Panic disorder.  The patient returns  for psychiatric services and medication management.   She works at [employer] duties which she likes. Tolerating medications . Location: Patient reports baseline. Currently on lexapro  that has helped depression and anxiety.  Panic symptoms are infrequent. No side effects or rash. Lives by herself and not many responsibilies. occassional sleep disturbance.   . Severity: Depression:7/10 (0=Very depressed; 5=Neutral; 10=Very Happy)  Anxiety- 2/10 (0=no anxiety; 5= moderate/tolerable anxiety; 10= panic attacks)  . Duration: Started at age 31-no precipitating causes. Says it runs in family.  . Timing: No specific time of day of time of year.   . Context: No specific context.  . Modifying factors: Improved with taking antidepressants. No clear manic or psychotic symptoms. We talked about cutting down on lamictal which she wants.  . Associated signs and symptoms: As noted in psychiatric ROS  Suicidal Ideation: Negative Plan Formed: Negative Patient has means to carry out plan: Negative  Homicidal Ideation: Negative Plan Formed: Negative Patient has means to carry out plan: Negative  Review of Systems: Review of Systems  Constitutional: Negative for fever.  Cardiovascular: Negative for chest pain.  Skin: Negative for rash.  Neurological: Negative for tremors.  Psychiatric/Behavioral: Negative for depression, suicidal ideas and substance abuse.     Neurologic: Headache: Negative Seizure:  Negative Paresthesias: Negative  Past Medical, Family, Social History:  Past Medical History  Diagnosis Date  . ASCUS (atypical squamous cells of undetermined significance) on Pap smear 03/2006    NEG FOR HISH RISK HPV  . Increased prolactin level (HCC) 01/2007    WAS 37-- RECHECK WAS 28, 16 in 2011  . LGSIL (low grade squamous intraepithelial dysplasia) 07/2009    C&B, ECC NEGATIVE  . Depression   . Ovarian cyst, left 2014   Family History  Problem Relation Age of Onset  . Breast cancer Mother     metastized to liver  . Alcohol abuse Mother   . Depression Mother   . Breast cancer Father     testicular and then unknown later in life  . Bipolar disorder Sister    Social History   Social History  . Marital Status: Divorced    Spouse Name: N/A  . Number of Children: N/A  . Years of Education: N/A   Social History Main Topics  . Smoking status: Former Games developer  . Smokeless tobacco: Never Used     Comment: quit 28 years ago  . Alcohol Use: 0.6 oz/week    1 Glasses of wine per week     Comment: socially  . Drug Use: No  . Sexual Activity:    Partners: Male    Birth Control/ Protection: Post-menopausal   Other Topics Concern  . None   Social History Narrative   Outpatient Encounter Prescriptions as of 05/15/2015  Medication Sig  . buPROPion (WELLBUTRIN SR) 200 MG 12 hr tablet TAKE 1 TABLET (200 MG TOTAL) BY MOUTH DAILY.  Marland Kitchen escitalopram (LEXAPRO) 20 MG tablet Take 1.5 tablets (30 mg total) by mouth daily.  Marland Kitchen ibuprofen (ADVIL,MOTRIN)  200 MG tablet Take 400 mg by mouth every 6 (six) hours as needed for moderate pain.  Marland Kitchen lamoTRIgine (LAMICTAL) 150 MG tablet Take 1 tablet (150 mg total) by mouth daily.  . mometasone-formoterol (DULERA) 100-5 MCG/ACT AERO Inhale 2 puffs into the lungs 2 (two) times daily.  . [DISCONTINUED] escitalopram (LEXAPRO) 5 MG tablet Take 1 tablet (5 mg total) by mouth daily. (Patient not taking: Reported on 05/15/2015)   No facility-administered  encounter medications on file as of 05/15/2015.     Physical Exam: Constitutional:  BP 124/70 mmHg  Pulse 63  Ht 5\' 5"  (1.651 m)  Wt 176 lb (79.833 kg)  BMI 29.29 kg/m2  SpO2 93%  LMP 05/27/2010  General Appearance: alert, oriented, no acute distress, well nourished and obese Musculoskeletal: Gait & Station: normal Patient leans: N/A  Psychiatric: General Appearance: Casual and Well Groomed  Patent attorney::  Good  Speech:  Clear and Coherent and Normal Rate  Volume:  Normal  Mood:  euthymic   Affect:  Appropriate, Congruent and Full Range  Thought Process:  Coherent, Linear and Logical  Orientation:  Full (Time, Place, and Person)  Thought Content:  WDL  Suicidal Thoughts:  No  Homicidal Thoughts:  No  Memory:  Immediate;   Good Recent;   Fair Remote;   Fair  Judgement:  Good  Insight:  Good  Psychomotor Activity:  Normal  Concentration:  Negative  Recall:  Negative  Akathisia:  Negative  Handed:  Right  AIMS (if indicated): Not indicated  Assets:  Communication Skills Desire for Improvement Financial Resources/Insurance Housing Intimacy Leisure Time Physical Health Resilience Social Support Talents/Skills Transportation Vocational/Educational  Sleep:  Number of Hours: 7-8     Medical Decision Making (Choose Three): Established Problem, Stable/Improving (1), Review of Psycho-Social Stressors (1) and Review of Medication Regimen & Side Effects (2)  Assessment: Axis I: Major depressive disorder, recurrent, moderate-stable. GAD . Panic disorder per history   Plan:   Plan of Care:  PLAN:  1. Affirm with the patient that the medications are taken as ordered. Patient  expressed understanding of how their medications were to be used.    Laboratory:   No labs warranted at this time.   Psychotherapy: Therapy: brief supportive therapy provided.  Discussed psychosocial stressors in detail. More than 50% of the visit was spent on individual  therapy/counseling.   Medications:  Continue the following psychiatric medications as written prior to this appointment with the following changes::  Continue lexapro 30mg  for depression, anxiety and panic Continue Wellbutrin 200 for depression lamictal for mood symptoms.  Has meds will call for 3 months prescription. She wanted to cut down the Lamictal,  we'll wait till springtime.  -Risks and benefits, side effects and alternatives discussed with patient, she was given an opportunity to ask questions about her medication, illness, and treatment. All current psychiatric medications have been reviewed and discussed with the patient and adjusted as clinically appropriate. The patient has been provided an accurate and updated list of the medications being now prescribed.   Routine PRN Medications:  Negative  Consultations: The patient was encouraged to keep all PCP and specialty clinic appointments.    Safety Concerns:   Patient told to call clinic if any problems occur. Patient advised to go to  ER  if she should develop SI/HI, side effects, or if symptoms worsen. Has crisis numbers to call if needed.    Other:   8. Patient was instructed to return to clinic in 3  months.  9. The patient was advised to call and cancel their mental health appointment within 24 hours of the appointment, if they are unable to keep the appointment, as well as the three no show and termination from clinic policy. 10. The patient expressed understanding of the plan and agrees with the above. lamictal will be half dose at next visit.  Time spent: 25 minutes   Thresa Ross, M.D.  05/15/2015 1:07 PM

## 2015-08-04 ENCOUNTER — Encounter (HOSPITAL_COMMUNITY): Payer: Self-pay | Admitting: Psychiatry

## 2015-08-04 ENCOUNTER — Ambulatory Visit (INDEPENDENT_AMBULATORY_CARE_PROVIDER_SITE_OTHER): Payer: Federal, State, Local not specified - PPO | Admitting: Psychiatry

## 2015-08-04 VITALS — BP 124/64 | HR 75 | Ht 65.0 in | Wt 178.8 lb

## 2015-08-04 DIAGNOSIS — F411 Generalized anxiety disorder: Secondary | ICD-10-CM | POA: Diagnosis not present

## 2015-08-04 DIAGNOSIS — G4733 Obstructive sleep apnea (adult) (pediatric): Secondary | ICD-10-CM | POA: Insufficient documentation

## 2015-08-04 DIAGNOSIS — F331 Major depressive disorder, recurrent, moderate: Secondary | ICD-10-CM | POA: Diagnosis not present

## 2015-08-04 DIAGNOSIS — F410 Panic disorder [episodic paroxysmal anxiety] without agoraphobia: Secondary | ICD-10-CM | POA: Diagnosis not present

## 2015-08-04 MED ORDER — ESCITALOPRAM OXALATE 20 MG PO TABS
30.0000 mg | ORAL_TABLET | Freq: Every day | ORAL | Status: DC
Start: 2015-08-04 — End: 2015-11-23

## 2015-08-04 MED ORDER — BUPROPION HCL ER (SR) 100 MG PO TB12: ORAL_TABLET | ORAL | Status: DC

## 2015-08-04 MED ORDER — LAMOTRIGINE 150 MG PO TABS: 150.0000 mg | ORAL_TABLET | Freq: Every day | ORAL | Status: DC

## 2015-08-04 NOTE — Progress Notes (Signed)
Patient ID: Lindsey Klein, female   DOB: Mar 04, 1956, 60 y.o.   MRN: [id number]   [facility] Follow-up Outpatient Visit  Lindsey Klein 1955-09-27 [id number] 60 y.o. 08/04/2015 3:15 PM  Chief Complaint: Follow up  History of Present Illness: HPI Comments: [name] is  a 60 y/o female with a past psychiatric history significant for Major Depressive Disorder, recurrent, severe without psychotic features, GAD and history of Panic disorder.  The patient returns  for psychiatric services and medication management.   She works at [employer] duties which she likes.  Recently he has been feeling somewhat more agitated. Has gained some weight and is looking forward to get a sleep study done. Otherwise no psychosocial issues change patient essentially was my shift and has some issues because she has to wake up at night and sleep during the day Panic symptoms are infrequent.   . Severity: Depression:7/10 (0=Very depressed; 5=Neutral; 10=Very Happy)  Anxiety- 2/10 (0=no anxiety; 5= moderate/tolerable anxiety; 10= panic attacks)  . Duration: Started at age 16-no precipitating causes. Says it runs in family.  . Timing: No specific time of day of time of year.   . Context: No specific context.  . Modifying factors: Improved with taking antidepressants. No clear manic or psychotic symptoms. We talked about cutting down on lamictal which she wants.  . Associated signs and symptoms: As noted in psychiatric ROS  Suicidal Ideation: Negative Plan Formed: Negative Patient has means to carry out plan: Negative  Homicidal Ideation: Negative Plan Formed: Negative Patient has means to carry out plan: Negative  Review of Systems: Review of Systems  Constitutional: Negative for fever.  Cardiovascular: Negative for chest pain.  Skin: Negative for rash.  Neurological: Negative for tremors.  Psychiatric/Behavioral: Negative for depression, suicidal ideas and substance abuse.  The patient has insomnia.      Neurologic: Headache: Negative Seizure: Negative Paresthesias: Negative  Past Medical, Family, Social History:  Past Medical History  Diagnosis Date  . ASCUS (atypical squamous cells of undetermined significance) on Pap smear 03/2006    NEG FOR HISH RISK HPV  . Increased prolactin level (HCC) 01/2007    WAS 37-- RECHECK WAS 28, 16 in 2011  . LGSIL (low grade squamous intraepithelial dysplasia) 07/2009    C&B, ECC NEGATIVE  . Depression   . Ovarian cyst, left 2014   Family History  Problem Relation Age of Onset  . Breast cancer Mother     metastized to liver  . Alcohol abuse Mother   . Depression Mother   . Breast cancer Father     testicular and then unknown later in life  . Bipolar disorder Sister    Social History   Social History  . Marital Status: Divorced    Spouse Name: N/A  . Number of Children: N/A  . Years of Education: N/A   Social History Main Topics  . Smoking status: Former Games developer  . Smokeless tobacco: Never Used     Comment: quit 28 years ago  . Alcohol Use: 0.6 oz/week    1 Glasses of wine per week     Comment: socially  . Drug Use: No  . Sexual Activity:    Partners: Male    Birth Control/ Protection: Post-menopausal   Other Topics Concern  . None   Social History Narrative   Outpatient Encounter Prescriptions as of 08/04/2015  Medication Sig  . buPROPion (WELLBUTRIN SR) 100 MG 12 hr tablet TAKE 1 TABLET (200 MG TOTAL) BY  MOUTH DAILY.  Marland Kitchen escitalopram (LEXAPRO) 20 MG tablet Take 1.5 tablets (30 mg total) by mouth daily.  Marland Kitchen ibuprofen (ADVIL,MOTRIN) 200 MG tablet Take 400 mg by mouth every 6 (six) hours as needed for moderate pain.  Marland Kitchen lamoTRIgine (LAMICTAL) 150 MG tablet Take 1 tablet (150 mg total) by mouth daily.  Marland Kitchen latanoprost (XALATAN) 0.005 % ophthalmic solution   . mometasone-formoterol (DULERA) 100-5 MCG/ACT AERO Inhale 2 puffs into the lungs 2 (two) times daily.  . [DISCONTINUED] buPROPion (WELLBUTRIN  SR) 200 MG 12 hr tablet TAKE 1 TABLET (200 MG TOTAL) BY MOUTH DAILY.  . [DISCONTINUED] escitalopram (LEXAPRO) 20 MG tablet Take 1.5 tablets (30 mg total) by mouth daily.  . [DISCONTINUED] lamoTRIgine (LAMICTAL) 150 MG tablet Take 1 tablet (150 mg total) by mouth daily.   No facility-administered encounter medications on file as of 08/04/2015.     Physical Exam: Constitutional:  BP 124/64 mmHg  Pulse 75  Ht  (1.651 m)  Wt 178 lb 12.8 oz (81.103 kg)  BMI 29.75 kg/m2  SpO2 95%  LMP 05/27/2010  General Appearance: alert, oriented, no acute distress, well nourished and obese Musculoskeletal: Gait & Station: normal Patient leans: N/A  Psychiatric: General Appearance: Casual and Well Groomed  Patent attorney::  Good  Speech:  Clear and Coherent and Normal Rate  Volume:  Normal  Mood:  Fair . Not depressed. Endorses agitation   Affect:  Appropriate, Congruent and Full Range  Thought Process:  Coherent, Linear and Logical  Orientation:  Full (Time, Place, and Person)  Thought Content:  WDL  Suicidal Thoughts:  No  Homicidal Thoughts:  No  Memory:  Immediate;   Good Recent;   Fair Remote;   Fair  Judgement:  Good  Insight:  Good  Psychomotor Activity:  Normal  Concentration:  Negative  Recall:  Negative  Akathisia:  Negative  Handed:  Right  AIMS (if indicated): Not indicated  Assets:  Communication Skills Desire for Improvement Financial Resources/Insurance Housing Intimacy Leisure Time Physical Health Resilience Social Support Talents/Skills Transportation Vocational/Educational  Sleep:  Number of Hours: 7-8     Assessment: Axis I: Major depressive disorder, recurrent, moderate-stable. GAD . Panic disorder per history   Plan:   Plan of Care:  PLAN:  1. Affirm with the patient that the medications are taken as ordered. Patient  expressed understanding of how their medications were to be used.    Laboratory:   No labs warranted at this time.    Psychotherapy: Therapy: brief supportive therapy provided.  Discussed psychosocial stressors in detail. More than 50% of the visit was spent on individual therapy/counseling.   Medications:  Continue the following psychiatric medications as written prior to this appointment with the following changes::  Continue lexapro  for depression, anxiety and panic Lower  Wellbutrin 100 for concern if causing agitation at this dose. It does help depression lamictal for mood symptoms.  No rash.  Has meds will call for 3 months prescription except wellbutrin since changing dose.  Reviewed sleep hgyiene. Sleep study would help   -Risks and benefits, side effects and alternatives discussed with patient, she was given an opportunity to ask questions about her medication, illness, and treatment. All current psychiatric medications have been reviewed and discussed with the patient and adjusted as clinically appropriate. The patient has been provided an accurate and updated list of the medications being now prescribed.   Routine PRN Medications:  Negative  Consultations: The patient was encouraged to keep all PCP and  specialty clinic appointments.    Follow up in 1 month. She wants to come in 3 months will call end of month for refill. Time spent: 25 minutes     Thresa RossNADEEM Jamison Soward, M.D.  08/04/2015 3:15 PM

## 2015-09-17 ENCOUNTER — Other Ambulatory Visit (HOSPITAL_COMMUNITY): Payer: Self-pay | Admitting: Psychiatry

## 2015-09-17 NOTE — Telephone Encounter (Signed)
Received medication request from [facility] for Wellbutrin 100mg . Per Dr. Gilmore LarocheAkhtar, medication refill for Wellbutrin 100mg , #30 was authorized. Rx was sent to pharmacy. Pt is schedule for a f/u appt on 10/28/15. Called and informed pt of rx status. Pt verbalizes understanding.

## 2015-10-28 ENCOUNTER — Encounter (HOSPITAL_COMMUNITY): Payer: Self-pay | Admitting: Psychiatry

## 2015-10-28 ENCOUNTER — Ambulatory Visit (INDEPENDENT_AMBULATORY_CARE_PROVIDER_SITE_OTHER): Payer: Federal, State, Local not specified - PPO | Admitting: Psychiatry

## 2015-10-28 DIAGNOSIS — F411 Generalized anxiety disorder: Secondary | ICD-10-CM

## 2015-10-28 DIAGNOSIS — F410 Panic disorder [episodic paroxysmal anxiety] without agoraphobia: Secondary | ICD-10-CM

## 2015-10-28 DIAGNOSIS — F331 Major depressive disorder, recurrent, moderate: Secondary | ICD-10-CM | POA: Diagnosis not present

## 2015-10-28 MED ORDER — BUPROPION HCL ER (SR) 100 MG PO TB12
100.0000 mg | ORAL_TABLET | Freq: Every day | ORAL | 1 refills | Status: DC
Start: 2015-10-28 — End: 2016-01-21

## 2015-10-28 NOTE — Progress Notes (Signed)
Patient ID: Lindsey Klein, female   DOB: 1955-10-17, 60 y.o.   MRN: [id number]   [facility] Follow-up Outpatient Visit  Lindsey Klein 1955/08/05 [id number] 60 y.o. 10/28/2015 11:42 AM  Chief Complaint: Follow up  History of Present Illness: HPI Comments: Ms. Lindsey Klein is  a 61 y/o female with a past psychiatric history significant for Major Depressive Disorder, recurrent, severe without psychotic features, GAD and history of Panic disorder.  The patient returns  for psychiatric services and medication management.   She works at [employer] duties which she likes.  Last visit to cut down the Wellbutrin to 100 mg because of some concern about agitation. She is feeling less agitated she feels it has helped. Panic symptoms are infrequent.   . Severity: Depression:7/10 (0=Very depressed; 5=Neutral; 10=Very Happy)  Anxiety- 2/10 (0=no anxiety; 5= moderate/tolerable anxiety; 10= panic attacks)  . Duration: Started at age 22-no precipitating causes. Says it runs in family.  . Timing: No specific time of day of time of year.   . Context: No specific context.  . Modifying factors: Improved with taking antidepressants. No clear manic or psychotic symptoms.   . Associated signs and symptoms: As noted in psychiatric ROS  Suicidal Ideation: Negative Plan Formed: Negative Patient has means to carry out plan: Negative  Homicidal Ideation: Negative Plan Formed: Negative Patient has means to carry out plan: Negative  Review of Systems: Review of Systems  Constitutional: Negative for fever.  Cardiovascular: Negative for chest pain.  Skin: Negative for rash.  Neurological: Negative for tingling and tremors.  Psychiatric/Behavioral: Negative for depression, substance abuse and suicidal ideas. The patient has insomnia.      Neurologic: Headache: Negative Seizure: Negative Paresthesias: Negative  Past Medical, Family, Social History:  Past Medical History:   Diagnosis Date  . ASCUS (atypical squamous cells of undetermined significance) on Pap smear 03/2006   NEG FOR HISH RISK HPV  . Depression   . Increased prolactin level (HCC) 01/2007   WAS 37-- RECHECK WAS 28, 16 in 2011  . LGSIL (low grade squamous intraepithelial dysplasia) 07/2009   C&B, ECC NEGATIVE  . Ovarian cyst, left 2014   Family History  Problem Relation Age of Onset  . Breast cancer Mother     metastized to liver  . Alcohol abuse Mother   . Depression Mother   . Breast cancer Father     testicular and then unknown later in life  . Bipolar disorder Sister    Social History   Social History  . Marital status: Divorced    Spouse name: N/A  . Number of children: N/A  . Years of education: N/A   Social History Main Topics  . Smoking status: Former Games developer  . Smokeless tobacco: Never Used     Comment: quit 28 years ago  . Alcohol use 0.6 oz/week    1 Glasses of wine per week     Comment: socially  . Drug use: No  . Sexual activity: Yes    Partners: Male    Birth control/ protection: Post-menopausal   Other Topics Concern  . None   Social History Narrative  . None   Outpatient Encounter Prescriptions as of 10/28/2015  Medication Sig  . buPROPion (WELLBUTRIN SR) 100 MG 12 hr tablet Take 1 tablet (100 mg total) by mouth daily.  Marland Kitchen escitalopram (LEXAPRO) 20 MG tablet Take 1.5 tablets (30 mg total) by mouth daily.  Marland Kitchen ibuprofen (ADVIL,MOTRIN) 200 MG tablet Take 400 mg by  mouth every 6 (six) hours as needed for moderate pain.  Marland Kitchen lamoTRIgine (LAMICTAL) 150 MG tablet Take 1 tablet (150 mg total) by mouth daily.  Marland Kitchen latanoprost (XALATAN) 0.005 % ophthalmic solution   . mometasone-formoterol (DULERA) 100-5 MCG/ACT AERO Inhale 2 puffs into the lungs 2 (two) times daily.  . [DISCONTINUED] buPROPion (WELLBUTRIN SR) 100 MG 12 hr tablet TAKE ONE TABLET BY MOUTH ONCE DAILY   No facility-administered encounter medications on file as of 10/28/2015.      Physical  Exam: Constitutional:  LMP 05/27/2010   General Appearance: alert, oriented, no acute distress, well nourished and obese Musculoskeletal: Gait & Station: normal Patient leans: N/A  Psychiatric: General Appearance: Casual and Well Groomed  Eye Contact::  Good  Speech:  Clear and Coherent and Normal Rate  Volume:  Normal  Mood:  Fair . Not depressed. Less agitated   Affect:  Appropriate, Congruent and Full Range  Thought Process:  Coherent, Linear and Logical  Orientation:  Full (Time, Place, and Person)  Thought Content:  WDL  Suicidal Thoughts:  No  Homicidal Thoughts:  No  Memory:  Immediate;   Good Recent;   Fair Remote;   Fair  Judgement:  Good  Insight:  Good  Psychomotor Activity:  Normal  Concentration:  Negative  Recall:  Negative  Akathisia:  Negative  Handed:  Right  AIMS (if indicated): Not indicated  Assets:  Communication Skills Desire for Improvement Financial Resources/Insurance Housing Intimacy Leisure Time Physical Health Resilience Social Support Talents/Skills Transportation Vocational/Educational  Sleep:  Number of Hours: 7-8     Assessment: Axis I: Major depressive disorder, recurrent, moderate-stable. GAD . Panic disorder per history   Plan:   Plan of Care:  PLAN:  1. Affirm with the patient that the medications are taken as ordered. Patient  expressed understanding of how their medications were to be used.    Laboratory:   No labs warranted at this time.   Psychotherapy: Therapy: brief supportive therapy provided.  Discussed psychosocial stressors in detail. More than 50% of the visit was spent on individual therapy/counseling.   Medications:  Continue the following psychiatric medications as written prior to this appointment with the following changes::  Continue lexapro  for depression, anxiety and panic Continue lower dose of  Wellbutrin 100 for concern if causing agitation at this dose. It does help depression lamictal  for mood symptoms.  No rash.  Has meds will call for 3 months prescription except wellbutrin since changing dose.  Reviewed sleep hgyiene. Sleep study would help   -Risks and benefits, side effects and alternatives discussed with patient, she was given an opportunity to ask questions about her medication, illness, and treatment. All current psychiatric medications have been reviewed and discussed with the patient and adjusted as clinically appropriate. The patient has been provided an accurate and updated list of the medications being now prescribed.   Routine PRN Medications:  Negative  Consultations: The patient was encouraged to keep all PCP and specialty clinic appointments.     She wants to come in 3 months will call end of month for refill. Time spent: 25 minutes     Thresa Ross, M.D.  10/28/2015 11:42 AM               Patient ID: Lindsey Klein, female   DOB: [birth year], 60 y.o.   MRN: [id number]

## 2015-11-20 ENCOUNTER — Telehealth (HOSPITAL_COMMUNITY): Payer: Self-pay | Admitting: Psychiatry

## 2015-11-23 MED ORDER — LAMOTRIGINE 150 MG PO TABS: 150.0000 mg | ORAL_TABLET | Freq: Every day | ORAL | 0 refills | Status: DC

## 2015-11-23 MED ORDER — ESCITALOPRAM OXALATE 20 MG PO TABS: 30.0000 mg | ORAL_TABLET | Freq: Every day | ORAL | 0 refills | Status: DC

## 2015-11-23 NOTE — Telephone Encounter (Signed)
Pt request refills for Lamictal , Lexapro and Wellbutrin to be sent to [facility]. Per Dr. Gilmore LarocheAkhtar, pt is authorized for refills for Lamictal 150mg , #90, Lexapro 20mg ,#135. Refills were sent to pharmacy. [facility]. Pt has a f/u appt on 11/1.  LVM informing pt of refill status.

## 2016-01-21 ENCOUNTER — Telehealth (HOSPITAL_COMMUNITY): Payer: Self-pay | Admitting: Psychiatry

## 2016-01-21 MED ORDER — BUPROPION HCL ER (SR) 100 MG PO TB12: 100.0000 mg | ORAL_TABLET | Freq: Every day | ORAL | 0 refills | Status: DC

## 2016-01-21 NOTE — Telephone Encounter (Signed)
Pt request refills for Wellbutrin. Medication was filled on 8/2 with an additional refill. Per Dr. Gilmore LarocheAkhtar, refill is authorized for Wellbutrin 100mg , #30. [facility] . Pt has a f/u appt on 11/9.  LVM informing pt of refill status.

## 2016-01-21 NOTE — Telephone Encounter (Signed)
Pt is almost out of wellbutrin rx. Can we write a rx for pt. We had to move appointment due to [place] being out of the office.

## 2016-01-27 ENCOUNTER — Ambulatory Visit (HOSPITAL_COMMUNITY): Payer: Self-pay | Admitting: Psychiatry

## 2016-02-04 ENCOUNTER — Ambulatory Visit (HOSPITAL_COMMUNITY): Payer: Self-pay | Admitting: Psychiatry

## 2016-02-09 ENCOUNTER — Encounter (HOSPITAL_COMMUNITY): Payer: Self-pay | Admitting: Psychiatry

## 2016-02-09 ENCOUNTER — Telehealth (HOSPITAL_COMMUNITY): Payer: Self-pay | Admitting: *Deleted

## 2016-02-09 ENCOUNTER — Ambulatory Visit (INDEPENDENT_AMBULATORY_CARE_PROVIDER_SITE_OTHER): Payer: Federal, State, Local not specified - PPO | Admitting: Psychiatry

## 2016-02-09 VITALS — BP 128/72 | HR 67 | Resp 16 | Ht 65.0 in | Wt 181.0 lb

## 2016-02-09 DIAGNOSIS — Z818 Family history of other mental and behavioral disorders: Secondary | ICD-10-CM

## 2016-02-09 DIAGNOSIS — F411 Generalized anxiety disorder: Secondary | ICD-10-CM

## 2016-02-09 DIAGNOSIS — F331 Major depressive disorder, recurrent, moderate: Secondary | ICD-10-CM

## 2016-02-09 DIAGNOSIS — F410 Panic disorder [episodic paroxysmal anxiety] without agoraphobia: Secondary | ICD-10-CM

## 2016-02-09 DIAGNOSIS — Z803 Family history of malignant neoplasm of breast: Secondary | ICD-10-CM

## 2016-02-09 DIAGNOSIS — Z79899 Other long term (current) drug therapy: Secondary | ICD-10-CM

## 2016-02-09 DIAGNOSIS — Z811 Family history of alcohol abuse and dependence: Secondary | ICD-10-CM | POA: Diagnosis not present

## 2016-02-09 MED ORDER — LAMOTRIGINE 100 MG PO TABS: 100.0000 mg | ORAL_TABLET | Freq: Every day | ORAL | 0 refills | Status: DC

## 2016-02-09 MED ORDER — BUPROPION HCL ER (SR) 100 MG PO TB12: 100.0000 mg | ORAL_TABLET | Freq: Every day | ORAL | 0 refills | Status: DC

## 2016-02-09 MED ORDER — ESCITALOPRAM OXALATE 20 MG PO TABS: 30.0000 mg | ORAL_TABLET | Freq: Every day | ORAL | 0 refills | Status: DC

## 2016-02-09 NOTE — Telephone Encounter (Signed)
Pt was seen in office today. Per Dr. Gilmore LarocheAkhtar, please fax Wellbutrin Rx to [facility] @ [phone]. Confirmation received at 1133.

## 2016-02-09 NOTE — Progress Notes (Signed)
Patient ID: Lindsey Klein, female   DOB: 1956-01-17, 60 y.o.   MRN: [id number]   [facility] Follow-up Outpatient Visit  Lindsey Klein 1956-01-17 [id number] 60 y.o. 02/09/2016 11:15 AM  Chief Complaint: Follow up  History of Present Illness: HPI Comments: Ms. Lindsey Klein is  a 60 y/o female with a past psychiatric history significant for Major Depressive Disorder, recurrent, severe without psychotic features, GAD and history of Panic disorder.  The patient returns  for psychiatric services and medication management.     Depression: Manageable she is tolerating the low-dose of Wellbutrin. Less agitated. She wants to cut down the Lamictal he is on 150 mg is no rash Anxiety has not worsened except for holidays stress: Panic symptoms are infrequent.   . Severity: Depression:7/10 (0=Very depressed; 5=Neutral; 10=Very Happy)  Anxiety- 2/10 (0=no anxiety; 5= moderate/tolerable anxiety; 10= panic attacks)  . Duration: Started at age 60-no precipitating causes. Says it runs in family.  . Timing: No specific time of day of time of year.   . Context: No specific context.  . Modifying factors: Improved with taking antidepressants. No clear manic or psychotic symptoms.   . Associated signs and symptoms: As noted in psychiatric ROS  Suicidal Ideation: Negative Plan Formed: Negative Patient has means to carry out plan: Negative  Homicidal Ideation: Negative Plan Formed: Negative Patient has means to carry out plan: Negative  Review of Systems: Review of Systems  Constitutional: Negative for fever.  Cardiovascular: Negative for palpitations.  Skin: Negative for rash.  Neurological: Negative for tingling and tremors.  Psychiatric/Behavioral: Negative for depression, substance abuse and suicidal ideas.     Neurologic: Headache: Negative Seizure: Negative Paresthesias: Negative  Past Medical, Family, Social History:  Past Medical History:  Diagnosis Date  . ASCUS  (atypical squamous cells of undetermined significance) on Pap smear 03/2006   NEG FOR HISH RISK HPV  . Depression   . Increased prolactin level (HCC) 01/2007   WAS 37-- RECHECK WAS 28, 16 in 2011  . LGSIL (low grade squamous intraepithelial dysplasia) 07/2009   C&B, ECC NEGATIVE  . Ovarian cyst, left 2014   Family History  Problem Relation Age of Onset  . Breast cancer Mother     metastized to liver  . Alcohol abuse Mother   . Depression Mother   . Breast cancer Father     testicular and then unknown later in life  . Bipolar disorder Sister    Social History   Social History  . Marital status: Divorced    Spouse name: N/A  . Number of children: N/A  . Years of education: N/A   Social History Main Topics  . Smoking status: Former Games developermoker  . Smokeless tobacco: Never Used     Comment: quit 28 years ago  . Alcohol use 0.6 oz/week    1 Glasses of wine per week     Comment: socially  . Drug use: No  . Sexual activity: Yes    Partners: Male    Birth control/ protection: Post-menopausal   Other Topics Concern  . None   Social History Narrative  . None   Outpatient Encounter Prescriptions as of 02/09/2016  Medication Sig  . buPROPion (WELLBUTRIN SR) 100 MG 12 hr tablet Take 1 tablet (100 mg total) by mouth daily.  Marland Kitchen. escitalopram (LEXAPRO) 20 MG tablet Take 1.5 tablets (30 mg total) by mouth daily.  Marland Kitchen. ibuprofen (ADVIL,MOTRIN) 200 MG tablet Take 400 mg by mouth every 6 (six) hours as needed  for moderate pain.  Marland Kitchen. lamoTRIgine (LAMICTAL) 100 MG tablet Take 1 tablet (100 mg total) by mouth daily.  Marland Kitchen. latanoprost (XALATAN) 0.005 % ophthalmic solution   . mometasone-formoterol (DULERA) 100-5 MCG/ACT AERO Inhale 2 puffs into the lungs 2 (two) times daily.  . [DISCONTINUED] buPROPion (WELLBUTRIN SR) 100 MG 12 hr tablet Take 1 tablet (100 mg total) by mouth daily.  . [DISCONTINUED] escitalopram (LEXAPRO) 20 MG tablet Take 1.5 tablets (30 mg total) by mouth daily.  . [DISCONTINUED]  lamoTRIgine (LAMICTAL) 150 MG tablet Take 1 tablet (150 mg total) by mouth daily.   No facility-administered encounter medications on file as of 02/09/2016.      Physical Exam: Constitutional:  BP 128/72 (BP Location: Right Arm, Patient Position: Sitting, Cuff Size: Normal)   Pulse 67   Resp 16   Ht 5\' 5"  (1.651 m)   Wt 181 lb (82.1 kg)   LMP 05/27/2010   SpO2 97%   BMI 30.12 kg/m   General Appearance: alert, oriented, no acute distress, well nourished and obese Musculoskeletal: Gait & Station: normal Patient leans: N/A  Psychiatric: General Appearance: Casual and Well Groomed  Patent attorneyye Contact::  Good  Speech:  Clear and Coherent and Normal Rate  Volume:  Normal  Mood:  Fair .    Affect:  Appropriate, Congruent and Full Range  Thought Process:  Coherent, Linear and Logical  Orientation:  Full (Time, Place, and Person)  Thought Content:  WDL  Suicidal Thoughts:  No  Homicidal Thoughts:  No  Memory:  Immediate;   Good Recent;   Fair Remote;   Fair  Judgement:  Good  Insight:  Good  Psychomotor Activity:  Normal  Concentration:  Negative  Recall:  Negative  Akathisia:  Negative  Handed:  Right  AIMS (if indicated): Not indicated  Assets:  Communication Skills Desire for Improvement Financial Resources/Insurance Housing Intimacy Leisure Time Physical Health Resilience Social Support Talents/Skills Transportation Vocational/Educational  Sleep:  Number of Hours: 7-8     Assessment: Axis I: Major depressive disorder, recurrent, moderate-stable. GAD . Panic disorder per history   Plan:   Plan of Care:  PLAN:  1. Affirm with the patient that the medications are taken as ordered. Patient  expressed understanding of how their medications were to be used.    Laboratory:   No labs warranted at this time.   Psychotherapy: Therapy: brief supportive therapy provided.  Discussed psychosocial stressors in detail. More than 50% of the visit was spent on individual  therapy/counseling.   Medications:  Continue the following psychiatric medications as written prior to this appointment with the following changes::  Continue lexapro 30mg  for depression, anxiety and panic Continue lower dose of  Wellbutrin 100 for concern causing agitation at this dose. It does help depression lamictal for mood symptoms will lower to 100mg  qd . She wants to be on a lower dose.    No rash.  Has meds will call for 3 months prescription  Reviewed sleep hgyiene.    -Risks and benefits, side effects and alternatives discussed with patient, she was given an opportunity to ask questions about her medication, illness, and treatment. All current psychiatric medications have been reviewed and discussed with the patient and adjusted as clinically appropriate. The patient has been provided an accurate and updated list of the medications being now prescribed.   Routine PRN Medications:  Negative  Consultations: The patient was encouraged to keep all PCP and specialty clinic appointments.     She wants to  come in 3 months .Marland Kitchen Can call for early appointment if needed.  Time spent: 25 minutes     Thresa Ross, M.D.  02/09/2016 11:15 AM               Patient ID: Lindsey Klein, female   DOB: 11-18-1955, 60 y.o.   MRN: [id number]

## 2016-05-03 ENCOUNTER — Ambulatory Visit (HOSPITAL_COMMUNITY): Payer: Self-pay | Admitting: Psychiatry

## 2016-05-11 ENCOUNTER — Encounter (HOSPITAL_COMMUNITY): Payer: Self-pay | Admitting: Psychiatry

## 2016-05-11 ENCOUNTER — Ambulatory Visit (INDEPENDENT_AMBULATORY_CARE_PROVIDER_SITE_OTHER): Payer: Federal, State, Local not specified - PPO | Admitting: Psychiatry

## 2016-05-11 DIAGNOSIS — F331 Major depressive disorder, recurrent, moderate: Secondary | ICD-10-CM

## 2016-05-11 DIAGNOSIS — F410 Panic disorder [episodic paroxysmal anxiety] without agoraphobia: Secondary | ICD-10-CM

## 2016-05-11 DIAGNOSIS — Z803 Family history of malignant neoplasm of breast: Secondary | ICD-10-CM

## 2016-05-11 DIAGNOSIS — Z818 Family history of other mental and behavioral disorders: Secondary | ICD-10-CM

## 2016-05-11 DIAGNOSIS — F411 Generalized anxiety disorder: Secondary | ICD-10-CM

## 2016-05-11 DIAGNOSIS — Z79899 Other long term (current) drug therapy: Secondary | ICD-10-CM

## 2016-05-11 DIAGNOSIS — Z87891 Personal history of nicotine dependence: Secondary | ICD-10-CM

## 2016-05-11 DIAGNOSIS — Z811 Family history of alcohol abuse and dependence: Secondary | ICD-10-CM

## 2016-05-11 NOTE — Progress Notes (Signed)
Patient ID: Lindsey Klein, female   DOB: [birth year], 61 y.o.   MRN: [id number]   [facility]  Lindsey EvaMary A Scarantino [birth year] [id number] 61 y.o. 05/11/2016 1:18 PM  Chief Complaint: Follow up  History of Present Illness: HPI Comments: [name] is  a 61 y/o female with a past psychiatric history significant for Major Depressive Disorder, recurrent, severe without psychotic features, GAD and history of Panic disorder.  The patient returns  for psychiatric services and medication management.    Last visit he has cut down the Wellbutrin because of some concern about agitation and also lamotrigine was cut down to 100 mg apparently she cut down the Lexapro instead she started feeling somewhat anxious and depressed so increased back to Lexapro 30 mg.  She feels comfortable with the medication regimen but endorses food craving and this was the reason she wanted to cut down the Lexapro.  Overall holiday stresses gone she has infrequent panic like symptoms and anxiety but more so she still feels comfortable Lexapro although it causes food craving    . Severity: Depression:: 6/10. 10 being no depression Anxiety- 2/10. 10 being extreme anxiety  . Modifying factors: keeping busy . 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   . Associated signs and symptoms: As noted in psychiatric ROS    Review of Systems: Review of Systems  Constitutional: Negative for fever.  Cardiovascular: Negative for chest pain.  Skin: Negative for rash.  Neurological: Negative for tingling and tremors.  Psychiatric/Behavioral: Negative for depression, substance abuse and suicidal ideas.     Past Medical, Family, Social History:  Past Medical History:  Diagnosis Date  . ASCUS (atypical squamous cells of undetermined significance) on Pap smear 03/2006   NEG FOR HISH RISK HPV  . Depression   . Increased prolactin level (HCC) 01/2007   WAS 37-- RECHECK  WAS 28, 16 in 2011  . LGSIL (low grade squamous intraepithelial dysplasia) 07/2009   C&B, ECC NEGATIVE  . Ovarian cyst, left 2014   Family History  Problem Relation Age of Onset  . Breast cancer Mother     metastized to liver  . Alcohol abuse Mother   . Depression Mother   . Breast cancer Father     testicular and then unknown later in life  . Bipolar disorder Sister    Social History   Social History  . Marital status: Divorced    Spouse name: N/A  . Number of children: N/A  . Years of education: N/A   Social History Main Topics  . Smoking status: Former Games developermoker  . Smokeless tobacco: Never Used     Comment: quit 28 years ago  . Alcohol use 0.6 oz/week    1 Glasses of wine per week     Comment: socially  . Drug use: No  . Sexual activity: Yes    Partners: Male    Birth control/ protection: Post-menopausal   Other Topics Concern  . None   Social History Narrative  . None   Outpatient Encounter Prescriptions as of 05/11/2016  Medication Sig  . buPROPion (WELLBUTRIN SR) 100 MG 12 hr tablet Take 1 tablet (100 mg total) by mouth daily.  Marland Kitchen. escitalopram (LEXAPRO) 20 MG tablet Take 1.5 tablets (30 mg total) by mouth daily.  Marland Kitchen. ibuprofen (ADVIL,MOTRIN) 200 MG tablet Take 400 mg by mouth every 6 (six) hours as needed for moderate pain.  Marland Kitchen. lamoTRIgine (LAMICTAL) 100 MG tablet Take 1 tablet (  100 mg total) by mouth daily.  Marland Kitchen latanoprost (XALATAN) 0.005 % ophthalmic solution   . mometasone-formoterol (DULERA) 100-5 MCG/ACT AERO Inhale 2 puffs into the lungs 2 (two) times daily.   No facility-administered encounter medications on file as of 05/11/2016.      Physical Exam: Constitutional:  LMP 05/27/2010   General Appearance: alert, oriented, no acute distress, well nourished and obese Musculoskeletal: Gait & Station: normal Patient leans: N/A  Psychiatric: General Appearance: Casual and Well Groomed  Eye Contact::  Good  Speech:  Clear and Coherent and Normal Rate   Volume:  Normal  Mood:  Fair.    Affect:  Appropriate, Congruent and Full Range  Thought Process:  Coherent, Linear and Logical  Orientation:  Full (Time, Place, and Person)  Thought Content:  WDL  Suicidal Thoughts:  No  Homicidal Thoughts:  No  Memory:  Immediate;   Good Recent;   Fair Remote;   Fair  Judgement:  Good  Insight:  Good  Psychomotor Activity:  Normal  Concentration:  Negative  Recall:  Negative  Akathisia:  Negative  Handed:  Right  AIMS (if indicated): Not indicated  Assets:  Communication Skills Desire for Improvement Financial Resources/Insurance Housing Intimacy Leisure Time Physical Health Resilience Social Support Talents/Skills Transportation Vocational/Educational  Sleep:  Number of Hours: 7-8     Assessment: Axis I: Major depressive disorder, recurrent, moderate-stable. GAD . Panic disorder per history   Plan:   Plan of Care:  PLAN:  1. Affirm with the patient that the medications are taken as ordered. Patient  expressed understanding of how their medications were to be used.        Medications:  Continue the following psychiatric medications as written prior to this appointment with the following changes::  1. Depression: continue lexapro. Wants to consider prozac but maybe in spring. Does not want to lower lexapro. Patient need to work on more activities. Continue low dose wellbutrin 2. GAD: continue lexapro. For panic and anxiety  3> mood disorder:continue augmentation with lamictal that helps. 100mg .  .   Has meds will call for 3 months prescription  Reviewed sleep hgyiene.    -Risks and benefits, side effects and alternatives discussed with patient, she was given an opportunity to ask questions about her medication, illness, and treatment. All current psychiatric medications have been reviewed and discussed with the patient and adjusted as clinically appropriate. The patient has been provided an accurate and updated list of  the medications being now prescribed.   Routine PRN Medications:  Negative  Consultations: The patient was encouraged to keep all PCP and specialty clinic appointments.    More than 50% of 25 minutes spent in counseling and coordination of care including patient education and review side effects and coping skills.      Thresa Ross, M.D.  05/11/2016 1:18 PM               Patient ID: Lindsey Eva, female   DOB: Aug 13, 1955, 61 y.o.   MRN: [id number]

## 2016-07-06 ENCOUNTER — Other Ambulatory Visit (HOSPITAL_COMMUNITY): Payer: Self-pay | Admitting: *Deleted

## 2016-07-06 MED ORDER — BUPROPION HCL ER (SR) 100 MG PO TB12: 100.0000 mg | ORAL_TABLET | Freq: Every day | ORAL | 0 refills | Status: DC

## 2016-07-06 NOTE — Telephone Encounter (Signed)
Pt lvm requesting a refill for Wellbutrin. Per dr. Gilmore Laroche, refill request is authorize for Wellbutrin , #90. [facility] to [facility]. Pt's next apt is schedule on 07/20/16. Pt notified of refill status. Pt verbalizes understanding.

## 2016-07-20 ENCOUNTER — Ambulatory Visit (HOSPITAL_COMMUNITY): Payer: Self-pay | Admitting: Psychiatry

## 2016-08-04 ENCOUNTER — Encounter (HOSPITAL_COMMUNITY): Payer: Self-pay | Admitting: Psychiatry

## 2016-08-04 ENCOUNTER — Ambulatory Visit (INDEPENDENT_AMBULATORY_CARE_PROVIDER_SITE_OTHER): Payer: Federal, State, Local not specified - PPO | Admitting: Psychiatry

## 2016-08-04 VITALS — BP 126/74 | HR 81 | Resp 16 | Ht 65.0 in | Wt 182.0 lb

## 2016-08-04 DIAGNOSIS — F410 Panic disorder [episodic paroxysmal anxiety] without agoraphobia: Secondary | ICD-10-CM | POA: Diagnosis not present

## 2016-08-04 DIAGNOSIS — F331 Major depressive disorder, recurrent, moderate: Secondary | ICD-10-CM

## 2016-08-04 DIAGNOSIS — F411 Generalized anxiety disorder: Secondary | ICD-10-CM

## 2016-08-04 DIAGNOSIS — Z87891 Personal history of nicotine dependence: Secondary | ICD-10-CM

## 2016-08-04 DIAGNOSIS — F39 Unspecified mood [affective] disorder: Secondary | ICD-10-CM

## 2016-08-04 DIAGNOSIS — Z79899 Other long term (current) drug therapy: Secondary | ICD-10-CM | POA: Diagnosis not present

## 2016-08-04 MED ORDER — LAMOTRIGINE 100 MG PO TABS: 100.0000 mg | ORAL_TABLET | Freq: Every day | ORAL | 0 refills | Status: DC

## 2016-08-04 MED ORDER — ESCITALOPRAM OXALATE 20 MG PO TABS: 30.0000 mg | ORAL_TABLET | Freq: Every day | ORAL | 0 refills | Status: DC

## 2016-08-04 NOTE — Progress Notes (Signed)
Patient ID: Lindsey Klein, female   DOB: 1955-07-06, 61 y.o.   MRN: [id number]   [facility]  Lindsey Klein 1955-07-06 [id number] 61 y.o. 08/04/2016 1:29 PM  Chief Complaint: Follow up  History of Present Illness: HPI Comments: Ms. Lindsey RockerSherman is  a 61 y/o female with a past psychiatric history significant for Major Depressive Disorder, recurrent, severe without psychotic features, GAD and history of Panic disorder.  The patient returns  for psychiatric services and medication management.    She is doing fair with medications does not want to change to Prozac because she does feel comfortable the Lexapro is trying to work on weight maintenance she works night shift at the [employer] and she likes there are no rash reported on Lamictal mood symptoms are stable Anxiety is manageable no frequent panic attacks   Depression:: 7/10   . Modifying factors: night work she likes      Review of Systems: Review of Systems  Constitutional: Negative for fever.  Cardiovascular: Negative for palpitations.  Skin: Negative for rash.  Neurological: Negative for tingling and tremors.  Psychiatric/Behavioral: Negative for depression, substance abuse and suicidal ideas.     Past Medical, Family, Social History:  Past Medical History:  Diagnosis Date  . ASCUS (atypical squamous cells of undetermined significance) on Pap smear 03/2006   NEG FOR HISH RISK HPV  . Depression   . Increased prolactin level (HCC) 01/2007   WAS 37-- RECHECK WAS 28, 16 in 2011  . LGSIL (low grade squamous intraepithelial dysplasia) 07/2009   C&B, ECC NEGATIVE  . Ovarian cyst, left 2014   Family History  Problem Relation Age of Onset  . Breast cancer Mother        metastized to liver  . Alcohol abuse Mother   . Depression Mother   . Breast cancer Father        testicular and then unknown later in life  . Bipolar disorder Sister    Social History   Social History  .  Marital status: Divorced    Spouse name: N/A  . Number of children: N/A  . Years of education: N/A   Social History Main Topics  . Smoking status: Former Games developermoker  . Smokeless tobacco: Never Used     Comment: quit 28 years ago  . Alcohol use 0.6 oz/week    1 Glasses of wine per week     Comment: socially  . Drug use: No  . Sexual activity: Yes    Partners: Male    Birth control/ protection: Post-menopausal   Other Topics Concern  . None   Social History Narrative  . None   Outpatient Encounter Prescriptions as of 08/04/2016  Medication Sig  . buPROPion (WELLBUTRIN SR) 100 MG 12 hr tablet Take 1 tablet (100 mg total) by mouth daily.  Marland Kitchen. escitalopram (LEXAPRO) 20 MG tablet Take 1.5 tablets (30 mg total) by mouth daily.  Marland Kitchen. ibuprofen (ADVIL,MOTRIN) 200 MG tablet Take 400 mg by mouth every 6 (six) hours as needed for moderate pain.  Marland Kitchen. lamoTRIgine (LAMICTAL) 100 MG tablet Take 1 tablet (100 mg total) by mouth daily.  Marland Kitchen. latanoprost (XALATAN) 0.005 % ophthalmic solution   . mometasone-formoterol (DULERA) 100-5 MCG/ACT AERO Inhale 2 puffs into the lungs 2 (two) times daily.  . [DISCONTINUED] escitalopram (LEXAPRO) 20 MG tablet Take 1.5 tablets (30 mg total) by mouth daily.  . [DISCONTINUED] lamoTRIgine (LAMICTAL) 100 MG tablet Take 1 tablet (100 mg total) by mouth  daily.   No facility-administered encounter medications on file as of 08/04/2016.      Physical Exam: Constitutional:  BP 126/74 (BP Location: Right Arm, Patient Position: Sitting, Cuff Size: Normal)   Pulse 81   Resp 16   Ht 5\' 5"  (1.651 m)   Wt 182 lb (82.6 kg)   LMP 05/27/2010   SpO2 95%   BMI 30.29 kg/m   General Appearance: alert, oriented, no acute distress, well nourished and obese Musculoskeletal: Gait & Station: normal Patient leans: N/A  Psychiatric: General Appearance: Casual and Well Groomed  Patent attorney::  Good  Speech:  Clear and Coherent and Normal Rate  Volume:  Normal  Mood: euthymic    Affect:  Full range  Thought Process:  Coherent, Linear and Logical  Orientation:  Full (Time, Place, and Person)  Thought Content:  WDL  Suicidal Thoughts:  No  Homicidal Thoughts:  No  Memory:  Immediate;   Good Recent;   Fair Remote;   Fair  Judgement:  Good  Insight:  Good  Psychomotor Activity:  Normal  Concentration:  Negative  Recall:  Negative  Akathisia:  Negative  Handed:  Right  AIMS (if indicated): Not indicated  Assets:  Communication Skills Desire for Improvement Financial Resources/Insurance Housing Intimacy Leisure Time Physical Health Resilience Social Support Talents/Skills Transportation Vocational/Educational  Sleep:  Number of Hours: 7-8     Assessment: Axis I: Major depressive disorder, recurrent, moderate-stable. GAD . Panic disorder per history   Plan:   Plan of Care:  PLAN:  1. Affirm with the patient that the medications are taken as ordered. Patient  expressed understanding of how their medications were to be used.        Medications:  Continue the following psychiatric medications as written prior to this appointment with the following changes::  1. Depression:; manageable. Will continue lamictal and lexapro 2. GAD:not worse. Continue lexapro  3> mood disorder: balanced. Continue lamictal .    discussed side effects, questions addressed. Working on weight loss FU 3 months. Renewed meds. Which were needed           Thresa Ross, M.D.  08/04/2016 1:29 PM               Patient ID: Lindsey Klein, female   DOB: 1955-08-08, 61 y.o.   MRN: [id number]

## 2016-10-26 ENCOUNTER — Ambulatory Visit (HOSPITAL_COMMUNITY): Payer: Self-pay | Admitting: Psychiatry

## 2016-11-07 ENCOUNTER — Ambulatory Visit (HOSPITAL_COMMUNITY): Payer: Federal, State, Local not specified - PPO | Admitting: Psychiatry

## 2016-11-16 ENCOUNTER — Encounter (HOSPITAL_COMMUNITY): Payer: Self-pay | Admitting: Psychiatry

## 2016-11-16 ENCOUNTER — Ambulatory Visit (INDEPENDENT_AMBULATORY_CARE_PROVIDER_SITE_OTHER): Payer: Federal, State, Local not specified - PPO | Admitting: Psychiatry

## 2016-11-16 DIAGNOSIS — F331 Major depressive disorder, recurrent, moderate: Secondary | ICD-10-CM | POA: Diagnosis not present

## 2016-11-16 DIAGNOSIS — Z811 Family history of alcohol abuse and dependence: Secondary | ICD-10-CM

## 2016-11-16 DIAGNOSIS — Z818 Family history of other mental and behavioral disorders: Secondary | ICD-10-CM

## 2016-11-16 DIAGNOSIS — Z6379 Other stressful life events affecting family and household: Secondary | ICD-10-CM | POA: Diagnosis not present

## 2016-11-16 DIAGNOSIS — F410 Panic disorder [episodic paroxysmal anxiety] without agoraphobia: Secondary | ICD-10-CM

## 2016-11-16 DIAGNOSIS — Z87891 Personal history of nicotine dependence: Secondary | ICD-10-CM

## 2016-11-16 DIAGNOSIS — F411 Generalized anxiety disorder: Secondary | ICD-10-CM

## 2016-11-16 MED ORDER — ESCITALOPRAM OXALATE 20 MG PO TABS: 30.0000 mg | ORAL_TABLET | Freq: Every day | ORAL | 0 refills | Status: DC

## 2016-11-16 MED ORDER — LAMOTRIGINE 100 MG PO TABS: 100.0000 mg | ORAL_TABLET | Freq: Every day | ORAL | 0 refills | Status: DC

## 2016-11-16 NOTE — Progress Notes (Signed)
Patient ID: Lindsey Klein, female   DOB: 02-17-1956, 61 y.o.   MRN: [id number]   [facility] Follow-up Outpatient Visit  Lindsey Klein 1955-09-22 [id number] 61 y.o. 11/16/2016 1:23 PM  Chief Complaint: Follow up  History of Present Illness: HPI Comments: Lindsey Klein is  a 60 y/o female with a past psychiatric history significant for Major Depressive Disorder, recurrent, severe without psychotic features, GAD and history of Panic disorder.  The patient returns  for psychiatric services and medication management.    She has some recent stress regarding setting of house of buying the house but it didn't go through. 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   lamictal helps the mood. No rash Anxiety is manageable no frequent panic attacks   Depression:: 7/10  . Modifying factors: night work, family     Review of Systems: Review of Systems  Constitutional: Negative for fever.  Cardiovascular: Negative for chest pain.  Skin: Negative for rash.  Neurological: Negative for tingling and tremors.  Psychiatric/Behavioral: Negative for depression, substance abuse and suicidal ideas.     Past Medical, Family, Social History:  Past Medical History:  Diagnosis Date  . ASCUS (atypical squamous cells of undetermined significance) on Pap smear 03/2006   NEG FOR HISH RISK HPV  . Depression   . Increased prolactin level (HCC) 01/2007   WAS 37-- RECHECK WAS 28, 16 in 2011  . LGSIL (low grade squamous intraepithelial dysplasia) 07/2009   C&B, ECC NEGATIVE  . Ovarian cyst, left 2014   Family History  Problem Relation Age of Onset  . Breast cancer Mother        metastized to liver  . Alcohol abuse Mother   . Depression Mother   . Breast cancer Father        testicular and then unknown later in life  .  Bipolar disorder Sister    Social History   Social History  . Marital status: Divorced    Spouse name: N/A  . Number of children: N/A  . Years of education: N/A   Social History Main Topics  . Smoking status: Former Games developer  . Smokeless tobacco: Never Used     Comment: quit 28 years ago  . Alcohol use 0.6 oz/week    1 Glasses of wine per week     Comment: socially  . Drug use: No  . Sexual activity: Yes    Partners: Male    Birth control/ protection: Post-menopausal   Other Topics Concern  . None   Social History Narrative  . None   Outpatient Encounter Prescriptions as of 11/16/2016  Medication Sig  . escitalopram (LEXAPRO) 20 MG tablet Take 1.5 tablets (30 mg total) by mouth daily.  Marland Kitchen ibuprofen (ADVIL,MOTRIN) 200 MG tablet Take 400 mg by mouth every 6 (six) hours as needed for moderate pain.  Marland Kitchen lamoTRIgine (LAMICTAL) 100 MG tablet Take 1 tablet (100 mg total) by mouth daily.  Marland Kitchen latanoprost (XALATAN) 0.005 % ophthalmic solution   . mometasone-formoterol (DULERA) 100-5 MCG/ACT AERO Inhale 2 puffs into the lungs 2 (two) times daily.  . [DISCONTINUED] buPROPion (WELLBUTRIN SR) 100 MG 12 hr tablet Take 1 tablet (100 mg total) by mouth daily.  . [DISCONTINUED] escitalopram (LEXAPRO) 20 MG tablet Take 1.5 tablets (30 mg total) by mouth daily.  . [DISCONTINUED] lamoTRIgine (  LAMICTAL) 100 MG tablet Take 1 tablet (100 mg total) by mouth daily.   No facility-administered encounter medications on file as of 11/16/2016.      Physical Exam: Constitutional:  LMP 05/27/2010   General Appearance: alert, oriented, no acute distress, well nourished and obese Musculoskeletal: Gait & Station: normal Patient leans: N/A   Psychiatric: General Appearance: Casual and Well Groomed  Patent attorney::  Good  Speech:  Clear and Coherent and Normal Rate  Volume:  Normal  Mood: fair   Affect: reactive  Thought Process:  Coherent, Linear and Logical  Orientation:  Full (Time, Place, and  Person)  Thought Content:  WDL  Suicidal Thoughts:  No  Homicidal Thoughts:  No  Memory:  Immediate;   Good Recent;   Fair Remote;   Fair  Judgement:  Good  Insight:  Good  Psychomotor Activity:  Normal  Concentration:  Negative  Recall:  Negative  Akathisia:  Negative  Handed:  Right  AIMS (if indicated): Not indicated  Assets:  Communication Skills Desire for Improvement Financial Resources/Insurance Housing Intimacy Leisure Time Physical Health Resilience Social Support Talents/Skills Transportation Vocational/Educational  Sleep:  Number of Hours: 7-8     Assessment: Axis I: Major depressive disorder, recurrent, moderate-stable. GAD . Panic disorder per history   Plan:   Plan of Care:  PLAN:  1. Affirm with the patient that the medications are taken as ordered. Patient  expressed understanding of how their medications were to be used.        Medications:  Continue the following psychiatric medications as written prior to this appointment with the following changes::  1. Depression:; manageble. Continue lamictal. Lexapro. Cut down wellbutrin 50mg  for one week and then stop.  2. GAD: manageable. Continue lexapro  3> mood disorder: balanced. Continue lamcital.   fU 3 months . Provided supportive therapy reviewed concerns.            Thresa Ross, M.D.  11/16/2016 1:23 PM               Patient ID: Lindsey Klein, female   DOB: April 07, 1955, 61 y.o.   MRN: [id number]

## 2016-12-01 ENCOUNTER — Other Ambulatory Visit (HOSPITAL_COMMUNITY): Payer: Self-pay | Admitting: *Deleted

## 2016-12-01 MED ORDER — BUPROPION HCL ER (SR) 100 MG PO TB12: 100.0000 mg | ORAL_TABLET | Freq: Every day | ORAL | 0 refills | Status: DC

## 2016-12-01 NOTE — Telephone Encounter (Signed)
Pt was seen on 11/16/16. At the visit, pt states she stop Wellbutrin. Pt called office today, pt would like to restart Wellbutrin 100mg . Pt would like rx sent to [facility]. Please advise.

## 2016-12-01 NOTE — Telephone Encounter (Signed)
Medication refill- pt call office requesting a refill for Wellbutrin. Per Dr. Gilmore LarocheAkhtar, refill is authorize for Wellbutrin 100mg , #90. [facility]. Pt's next apt is schedule on 02/14/17. Lvm informing pt of refill status. Nothing further is need at this time.

## 2016-12-01 NOTE — Telephone Encounter (Signed)
Can increase back to . Send prescription. She had earlier concern of agitation on that dose.

## 2016-12-23 ENCOUNTER — Other Ambulatory Visit: Payer: Self-pay | Admitting: Nurse Practitioner

## 2016-12-23 ENCOUNTER — Encounter (HOSPITAL_COMMUNITY): Payer: Self-pay | Admitting: Psychiatry

## 2016-12-23 ENCOUNTER — Ambulatory Visit (INDEPENDENT_AMBULATORY_CARE_PROVIDER_SITE_OTHER): Payer: Federal, State, Local not specified - PPO | Admitting: Psychiatry

## 2016-12-23 VITALS — BP 126/78 | HR 69 | Resp 16 | Ht 65.0 in | Wt 180.0 lb

## 2016-12-23 DIAGNOSIS — Z87891 Personal history of nicotine dependence: Secondary | ICD-10-CM

## 2016-12-23 DIAGNOSIS — F331 Major depressive disorder, recurrent, moderate: Secondary | ICD-10-CM | POA: Diagnosis not present

## 2016-12-23 DIAGNOSIS — F39 Unspecified mood [affective] disorder: Secondary | ICD-10-CM | POA: Diagnosis not present

## 2016-12-23 DIAGNOSIS — Z79899 Other long term (current) drug therapy: Secondary | ICD-10-CM

## 2016-12-23 DIAGNOSIS — Z1231 Encounter for screening mammogram for malignant neoplasm of breast: Secondary | ICD-10-CM

## 2016-12-23 DIAGNOSIS — F410 Panic disorder [episodic paroxysmal anxiety] without agoraphobia: Secondary | ICD-10-CM | POA: Diagnosis not present

## 2016-12-23 DIAGNOSIS — F411 Generalized anxiety disorder: Secondary | ICD-10-CM | POA: Diagnosis not present

## 2016-12-23 MED ORDER — LAMOTRIGINE 150 MG PO TABS: 150.0000 mg | ORAL_TABLET | Freq: Every day | ORAL | 1 refills | Status: DC

## 2016-12-23 NOTE — Progress Notes (Signed)
Patient ID: Lindsey Klein, female   DOB: 1955/04/25, 61 y.o.   MRN: [id number]   [facility] Follow-up Outpatient Visit  Lindsey Klein [birth year] [id number] 61 y.o. 12/23/2016 10:15 AM  Chief Complaint: Follow up  History of Present Illness: HPI Comments: Lindsey Klein is  a 61 y/o female with a past psychiatric history significant for Major Depressive Disorder, recurrent, severe without psychotic features, GAD and history of Panic disorder.  The patient returns  for psychiatric services and medication management.   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  She is taking Lamictal sometimes she still feels her mood becomes irritable endorses some anxiety symptoms and worries related to family and her house  Anxiety fluctuates   Depression:: 6.5/10. Gone down . Modifying factors: night work, family     Review of Systems: Review of Systems  Constitutional: Negative for fever.  Cardiovascular: Negative for palpitations.  Skin: Negative for rash.  Neurological: Negative for tingling and tremors.  Psychiatric/Behavioral: Positive for depression. Negative for substance abuse and suicidal ideas.     Past Medical, Family, Social History:  Past Medical History:  Diagnosis Date  . ASCUS (atypical squamous cells of undetermined significance) on Pap smear 03/2006   NEG FOR HISH RISK HPV  . Depression   . Increased prolactin level (HCC) 01/2007   WAS 37-- RECHECK WAS 28, 16 in 2011  . LGSIL (low grade squamous intraepithelial dysplasia) 07/2009   C&B, ECC NEGATIVE  . Ovarian cyst, left 2014   Family History  Problem Relation Age of Onset  . Breast cancer Mother        metastized to liver  . Alcohol abuse Mother   . Depression Mother   . Breast cancer Father        testicular and then unknown later in life  . Bipolar disorder Sister    Social History   Social History  . Marital status:  Divorced    Spouse name: N/A  . Number of children: N/A  . Years of education: N/A   Social History Main Topics  . Smoking status: Former Games developer  . Smokeless tobacco: Never Used     Comment: quit 28 years ago  . Alcohol use 0.6 oz/week    1 Glasses of wine per week     Comment: socially  . Drug use: No  . Sexual activity: Yes    Partners: Male    Birth control/ protection: Post-menopausal   Other Topics Concern  . None   Social History Narrative  . None   Outpatient Encounter Prescriptions as of 12/23/2016  Medication Sig  . buPROPion (WELLBUTRIN SR) 100 MG 12 hr tablet Take 1 tablet (100 mg total) by mouth daily.  Marland Kitchen escitalopram (LEXAPRO) 20 MG tablet Take 1.5 tablets (30 mg total) by mouth daily.  Marland Kitchen ibuprofen (ADVIL,MOTRIN) 200 MG tablet Take 400 mg by mouth every 6 (six) hours as needed for moderate pain.  Marland Kitchen lamoTRIgine (LAMICTAL) 150 MG tablet Take 1 tablet (150 mg total) by mouth daily.  Marland Kitchen latanoprost (XALATAN) 0.005 % ophthalmic solution   . mometasone-formoterol (DULERA) 100-5 MCG/ACT AERO Inhale 2 puffs into the lungs 2 (two) times daily.  . [DISCONTINUED] lamoTRIgine (LAMICTAL) 100 MG tablet Take 1 tablet (100 mg total) by mouth daily.   No facility-administered encounter medications on file as of 12/23/2016.      Physical Exam: Constitutional:  BP 126/78 (BP Location: Right  Arm, Patient Position: Sitting, Cuff Size: Normal)   Pulse 69   Resp 16   Ht  (1.651 m)   Wt 180 lb (81.6 kg)   LMP 05/27/2010   SpO2 98%   BMI 29.95 kg/m   General Appearance: alert, oriented, no acute distress, well nourished and obese Musculoskeletal: Gait & Station: normal Patient leans: N/A   Psychiatric: General Appearance: Casual and Well Groomed  Patent attorney::  Good  Speech:  Clear and Coherent and Normal Rate  Volume:  Normal  Mood: somewhat dysphoric   Affect: congruent  Thought Process:  Coherent, Linear and Logical  Orientation:  Full (Time, Place, and Person)   Thought Content:  WDL  Suicidal Thoughts:  No  Homicidal Thoughts:  No  Memory:  Immediate;   Good Recent;   Fair Remote;   Fair  Judgement:  Good  Insight:  Good  Psychomotor Activity:  Normal  Concentration:  Negative  Recall:  Negative  Akathisia:  Negative  Handed:  Right  AIMS (if indicated): Not indicated  Assets:  Communication Skills Desire for Improvement Financial Resources/Insurance Housing Intimacy Leisure Time Physical Health Resilience Social Support Talents/Skills Transportation Vocational/Educational  Sleep:  Number of Hours: 7-8     Assessment: Axis I: Major depressive disorder, recurrent, moderate-stable. GAD . Panic disorder per history   Plan:   Plan of Care:  PLAN:  1. Affirm with the patient that the medications are taken as ordered. Patient  expressed understanding of how their medications were to be used.        Medications:  Continue the following psychiatric medications as written prior to this appointment with the following changes::  1. Depression:; somewhat more down. Increase lamictal to .  2. GAD: fluctuates. Continue lexapro. Will wait for gene testing to change 3> mood disorder: feeling some irritable. Increase lamictal . No rash. Will wait for gene testing. Fu in 3 weeks or earlier if needed             Thresa Ross, M.D.  12/23/2016 10:15 AM               Patient ID: Lindsey Klein, female   DOB: 1955-08-16, 61 y.o.   MRN: [id number]

## 2017-01-06 ENCOUNTER — Ambulatory Visit (HOSPITAL_COMMUNITY): Payer: Self-pay | Admitting: Psychiatry

## 2017-01-06 ENCOUNTER — Ambulatory Visit: Payer: Self-pay

## 2017-01-10 ENCOUNTER — Encounter (HOSPITAL_COMMUNITY): Payer: Self-pay | Admitting: Psychiatry

## 2017-01-10 ENCOUNTER — Ambulatory Visit (INDEPENDENT_AMBULATORY_CARE_PROVIDER_SITE_OTHER): Payer: Federal, State, Local not specified - PPO | Admitting: Psychiatry

## 2017-01-10 DIAGNOSIS — Z818 Family history of other mental and behavioral disorders: Secondary | ICD-10-CM | POA: Diagnosis not present

## 2017-01-10 DIAGNOSIS — F411 Generalized anxiety disorder: Secondary | ICD-10-CM

## 2017-01-10 DIAGNOSIS — F410 Panic disorder [episodic paroxysmal anxiety] without agoraphobia: Secondary | ICD-10-CM | POA: Diagnosis not present

## 2017-01-10 DIAGNOSIS — F331 Major depressive disorder, recurrent, moderate: Secondary | ICD-10-CM

## 2017-01-10 DIAGNOSIS — Z87891 Personal history of nicotine dependence: Secondary | ICD-10-CM | POA: Diagnosis not present

## 2017-01-10 DIAGNOSIS — Z811 Family history of alcohol abuse and dependence: Secondary | ICD-10-CM

## 2017-01-10 MED ORDER — ESCITALOPRAM OXALATE 20 MG PO TABS: 20.0000 mg | ORAL_TABLET | Freq: Every day | ORAL | 0 refills | Status: DC

## 2017-01-10 MED ORDER — LAMOTRIGINE 150 MG PO TABS: 150.0000 mg | ORAL_TABLET | Freq: Every day | ORAL | 1 refills | Status: DC

## 2017-01-10 NOTE — Progress Notes (Signed)
Patient ID: Lindsey Klein, female   DOB: 28-May-1955, 61 y.o.   MRN: [id number]   [facility] Follow-up Outpatient Visit  Lindsey Klein 1955-04-02 [id number] 61 y.o. 01/10/2017 10:51 AM  Chief Complaint: Follow up  History of Present Illness: HPI Comments: Ms. Oddo is  a 61 y/o female with a past psychiatric history significant for Major Depressive Disorder, recurrent, severe without psychotic features, GAD and history of Panic disorder.  The patient returns  for psychiatric services and medication management.    Patient is returning for an early visit because of gene testing result s We increased the Lamictal last visit and that has helped the depression as well as she wanted to change the Lexapro we discussed gene testing and Prozac or Cymbalta would be better option but since she is doing better she is okay to continue Lexapro as of now Dose was decrased to   Energy better Sleep adequate   Depression:: 7/10. Somewhat better . Modifying factors: night work, family    Review of Systems: Review of Systems  Constitutional: Negative for fever.  Cardiovascular: Negative for chest pain.  Skin: Negative for rash.  Neurological: Negative for tingling and tremors.  Psychiatric/Behavioral: Negative for substance abuse and suicidal ideas.     Past Medical, Family, Social History:  Past Medical History:  Diagnosis Date  . ASCUS (atypical squamous cells of undetermined significance) on Pap smear 03/2006   NEG FOR HISH RISK HPV  . Depression   . Increased prolactin level (HCC) 01/2007   WAS 37-- RECHECK WAS 28, 16 in 2011  . LGSIL (low grade squamous intraepithelial dysplasia) 07/2009   C&B, ECC NEGATIVE  . Ovarian cyst, left 2014   Family History  Problem Relation Age of Onset  . Breast cancer Mother        metastized to liver  . Alcohol abuse Mother   . Depression Mother   . Breast cancer Father        testicular and then unknown later in life  .  Bipolar disorder Sister    Social History   Social History  . Marital status: Divorced    Spouse name: N/A  . Number of children: N/A  . Years of education: N/A   Social History Main Topics  . Smoking status: Former Games developer  . Smokeless tobacco: Never Used     Comment: quit 28 years ago  . Alcohol use 0.6 oz/week    1 Glasses of wine per week     Comment: socially  . Drug use: No  . Sexual activity: Yes    Partners: Male    Birth control/ protection: Post-menopausal   Other Topics Concern  . None   Social History Narrative  . None   Outpatient Encounter Prescriptions as of 01/10/2017  Medication Sig  . buPROPion (WELLBUTRIN SR) 100 MG 12 hr tablet Take 1 tablet (100 mg total) by mouth daily.  Marland Kitchen escitalopram (LEXAPRO) 20 MG tablet Take 1 tablet (20 mg total) by mouth daily.  Marland Kitchen ibuprofen (ADVIL,MOTRIN) 200 MG tablet Take 400 mg by mouth every 6 (six) hours as needed for moderate pain.  Marland Kitchen lamoTRIgine (LAMICTAL) 150 MG tablet Take 1 tablet (150 mg total) by mouth daily.  Marland Kitchen latanoprost (XALATAN) 0.005 % ophthalmic solution   . mometasone-formoterol (DULERA) 100-5 MCG/ACT AERO Inhale 2 puffs into the lungs 2 (two) times daily.  . [DISCONTINUED] escitalopram (LEXAPRO) 20 MG tablet Take 1.5 tablets (30 mg total) by mouth daily.  . [DISCONTINUED] lamoTRIgine (  LAMICTAL) 150 MG tablet Take 1 tablet (150 mg total) by mouth daily.   No facility-administered encounter medications on file as of 01/10/2017.      Physical Exam: Constitutional:  LMP 05/27/2010   General Appearance: alert, oriented, no acute distress, well nourished and obese Musculoskeletal: Gait & Station: normal Patient leans: N/A   Psychiatric: General Appearance: Casual and Well Groomed  Patent attorney::  Good  Speech:  Clear and Coherent and Normal Rate  Volume:  Normal  Mood: less depressed   Affect: congruent and improved  Thought Process:  Coherent, Linear and Logical  Orientation:  Full (Time, Place,  and Person)  Thought Content:  WDL  Suicidal Thoughts:  No  Homicidal Thoughts:  No  Memory:  Immediate;   Good Recent;   Fair Remote;   Fair  Judgement:  Good  Insight:  Good  Psychomotor Activity:  Normal  Concentration:  Negative  Recall:  Negative  Akathisia:  Negative  Handed:  Right  AIMS (if indicated): Not indicated  Assets:  Communication Skills Desire for Improvement Financial Resources/Insurance Housing Intimacy Leisure Time Physical Health Resilience Social Support Talents/Skills Transportation Vocational/Educational  Sleep:  Number of Hours: 7-8     Assessment: Axis I: Major depressive disorder, recurrent, moderate-stable. GAD . Panic disorder per history   Plan:   Plan of Care:  PLAN:  1. Affirm with the patient that the medications are taken as ordered. Patient  expressed understanding of how their medications were to be used.        Medications:  Continue the following psychiatric medications as written prior to this appointment with the following changes::  1. Depression:; improved. Continue lamcital . No rash 2. GAD: not worse. Can continue lexapro for now oother options to change to prozac or cymbalta according to gene results 3> mood disorder: less irritable. So will continue lamictal at  FU 2 months or early. Questions addressed Gene testing results reviewed            Thresa Ross, M.D.  01/10/2017 10:51 AM               Patient ID: Lindsey Klein, female   DOB: [birth year], 61 y.o.   MRN: [id number]

## 2017-01-18 ENCOUNTER — Ambulatory Visit (INDEPENDENT_AMBULATORY_CARE_PROVIDER_SITE_OTHER): Payer: Federal, State, Local not specified - PPO

## 2017-01-18 DIAGNOSIS — Z1231 Encounter for screening mammogram for malignant neoplasm of breast: Secondary | ICD-10-CM

## 2017-02-07 ENCOUNTER — Telehealth (HOSPITAL_COMMUNITY): Payer: Self-pay | Admitting: Psychiatry

## 2017-02-07 NOTE — Telephone Encounter (Signed)
Pt needs refill on all 3 meds. Please order these though the mail.

## 2017-02-08 MED ORDER — ESCITALOPRAM OXALATE 20 MG PO TABS: 20.0000 mg | ORAL_TABLET | Freq: Every day | ORAL | 2 refills | Status: DC

## 2017-02-08 MED ORDER — BUPROPION HCL ER (SR) 100 MG PO TB12: 100.0000 mg | ORAL_TABLET | Freq: Every day | ORAL | 0 refills | Status: DC

## 2017-02-08 MED ORDER — LAMOTRIGINE 150 MG PO TABS: 150.0000 mg | ORAL_TABLET | Freq: Every day | ORAL | 2 refills | Status: DC

## 2017-02-08 NOTE — Telephone Encounter (Signed)
Medication refill- pt called office requesting a refill for Wellbutrin, Lexapro, and Lamictal. Per Dr. Gilmore LarocheAkhtar, refills are authorize for Wellbutrin 100mg , #90, Lexapro 20mg , #30 w/ 2 refills and Lamictal 150mg , #30 w/ 2 refills. Rx's were sent to [facility]. Pt next apt is schedule on 03/08/17. Lvm informing pt of refill status.

## 2017-02-10 ENCOUNTER — Telehealth (HOSPITAL_COMMUNITY): Payer: Self-pay | Admitting: *Deleted

## 2017-02-10 DIAGNOSIS — M549 Dorsalgia, unspecified: Secondary | ICD-10-CM | POA: Insufficient documentation

## 2017-02-10 MED ORDER — LAMOTRIGINE 150 MG PO TABS: 150.0000 mg | ORAL_TABLET | Freq: Every day | ORAL | 0 refills | Status: DC

## 2017-02-10 NOTE — Telephone Encounter (Signed)
Medication refill- pt called office stating she need a refill for Lamictal. Pt states she is currently out of medication. Pt receives a mail order through [facility]. Mail order was sent to pharmacy on 02/07/17. Per Dr. Gilmore LarocheAkhtar, refill is authorize for Lamictal 150mg , #30. Rx was sent to pharmacy. Pt is schedule for a follow up apt on 03/08/17. Nothing further is need at this time.

## 2017-02-14 ENCOUNTER — Ambulatory Visit (HOSPITAL_COMMUNITY): Payer: Self-pay | Admitting: Psychiatry

## 2017-03-02 ENCOUNTER — Other Ambulatory Visit (HOSPITAL_COMMUNITY): Payer: Self-pay | Admitting: Psychiatry

## 2017-03-02 MED ORDER — BUPROPION HCL ER (SR) 100 MG PO TB12: 100.0000 mg | ORAL_TABLET | Freq: Every day | ORAL | 0 refills | Status: DC

## 2017-03-02 NOTE — Telephone Encounter (Signed)
[facility] to [facility] and they said the patient has a refill for lexapro but not bupropion. I sent in refill for bupropion per Dr. Gilmore LarocheAkhtar. Nothing further is needed at this time.

## 2017-03-02 NOTE — Telephone Encounter (Signed)
Pt needs refill on lexapro and wellbutrin put in mail order pharmacy.

## 2017-03-02 NOTE — Telephone Encounter (Signed)
Can send refill if due

## 2017-03-08 ENCOUNTER — Ambulatory Visit (HOSPITAL_COMMUNITY): Payer: Self-pay | Admitting: Psychiatry

## 2017-03-16 ENCOUNTER — Other Ambulatory Visit: Payer: Self-pay

## 2017-03-16 ENCOUNTER — Ambulatory Visit (HOSPITAL_COMMUNITY): Payer: Federal, State, Local not specified - PPO | Admitting: Psychiatry

## 2017-03-16 ENCOUNTER — Encounter (HOSPITAL_COMMUNITY): Payer: Self-pay | Admitting: Psychiatry

## 2017-03-16 VITALS — BP 128/80 | HR 75 | Ht 65.0 in | Wt 180.0 lb

## 2017-03-16 DIAGNOSIS — F411 Generalized anxiety disorder: Secondary | ICD-10-CM

## 2017-03-16 DIAGNOSIS — Z87891 Personal history of nicotine dependence: Secondary | ICD-10-CM | POA: Diagnosis not present

## 2017-03-16 DIAGNOSIS — F331 Major depressive disorder, recurrent, moderate: Secondary | ICD-10-CM | POA: Diagnosis not present

## 2017-03-16 DIAGNOSIS — Z811 Family history of alcohol abuse and dependence: Secondary | ICD-10-CM

## 2017-03-16 DIAGNOSIS — Z8659 Personal history of other mental and behavioral disorders: Secondary | ICD-10-CM

## 2017-03-16 DIAGNOSIS — Z818 Family history of other mental and behavioral disorders: Secondary | ICD-10-CM | POA: Diagnosis not present

## 2017-03-16 DIAGNOSIS — F410 Panic disorder [episodic paroxysmal anxiety] without agoraphobia: Secondary | ICD-10-CM

## 2017-03-16 DIAGNOSIS — Z79899 Other long term (current) drug therapy: Secondary | ICD-10-CM

## 2017-03-16 NOTE — Progress Notes (Signed)
Patient ID: Lindsey Klein, female   DOB: [birth year], 61 y.o.   MRN: [id number]   [facility]  Lindsey Klein [birth year] [id number] 61 y.o. 03/16/2017 1:25 PM  Chief Complaint: Follow up  History of Present Illness: HPI Comments: Ms. Lindsey Klein is  a [age] y/o female with a past psychiatric history significant for Major Depressive Disorder, recurrent, severe without psychotic features, GAD and history of Panic disorder.  The patient returns  for psychiatric services and medication management.    Doing fair . lexapro was decreased some. Holiday stress adds to anxiety   Energy better Sleep adequate  Duration more then 5 years Timing: when stressed Depression:: 7/10 . Modifying factors: night work family    Review of Systems: Review of Systems  Constitutional: Negative for fever.  Cardiovascular: Negative for palpitations.  Skin: Negative for rash.  Neurological: Negative for tingling and tremors.  Psychiatric/Behavioral: Negative for substance abuse and suicidal ideas.     Past Medical, Family, Social History:  Past Medical History:  Diagnosis Date  . ASCUS (atypical squamous cells of undetermined significance) on Pap smear 03/2006   NEG FOR HISH RISK HPV  . Depression   . Increased prolactin level (HCC) 01/2007   WAS 37-- RECHECK WAS 28, 16 in 2011  . LGSIL (low grade squamous intraepithelial dysplasia) 07/2009   C&B, ECC NEGATIVE  . Ovarian cyst, left 2014   Family History  Problem Relation Age of Onset  . Breast cancer Mother        metastized to liver  . Alcohol abuse Mother   . Depression Mother   . Breast cancer Father        testicular and then unknown later in life  . Bipolar disorder Sister    Social History   Socioeconomic History  . Marital status: Divorced    Spouse name: None  . Number of children: None  . Years of education: None  . Highest education level: None  Social Needs  . Financial resource strain:  None  . Food insecurity - worry: None  . Food insecurity - inability: None  . Transportation needs - medical: None  . Transportation needs - non-medical: None  Occupational History  . None  Tobacco Use  . Smoking status: Former Games developermoker  . Smokeless tobacco: Never Used  . Tobacco comment: quit 28 years ago  Substance and Sexual Activity  . Alcohol use: Yes    Alcohol/week: 0.6 oz    Types: 1 Glasses of wine per week    Comment: socially  . Drug use: No  . Sexual activity: Yes    Partners: Male    Birth control/protection: Post-menopausal  Other Topics Concern  . None  Social History Narrative  . None   Outpatient Encounter Medications as of 03/16/2017  Medication Sig  . buPROPion (WELLBUTRIN SR) 100 MG 12 hr tablet Take 1 tablet (100 mg total) by mouth daily.  Marland Kitchen. escitalopram (LEXAPRO) 20 MG tablet Take 1 tablet (20 mg total) daily by mouth.  Marland Kitchen. ibuprofen (ADVIL,MOTRIN) 200 MG tablet Take 400 mg by mouth every 6 (six) hours as needed for moderate pain.  Marland Kitchen. lamoTRIgine (LAMICTAL) 150 MG tablet Take 1 tablet (150 mg total) daily by mouth.  . latanoprost (XALATAN) 0.005 % ophthalmic solution   . mometasone-formoterol (DULERA) 100-5 MCG/ACT AERO Inhale 2 puffs into the lungs 2 (two) times daily.   No facility-administered encounter medications on file as of 03/16/2017.      Physical Exam:  Constitutional:  BP 128/80 (BP Location: Left Arm, Patient Position: Sitting, Cuff Size: Normal)   Pulse 75   Ht 5\' 5"  (1.651 m)   Wt 180 lb (81.6 kg)   LMP 05/27/2010   BMI 29.95 kg/m   General Appearance: alert, oriented, no acute distress, well nourished and obese Musculoskeletal: Gait & Station: normal Patient leans: N/A   Psychiatric: General Appearance: Casual and Well Groomed  Patent attorneyye Contact::  Good  Speech:  Clear and Coherent and Normal Rate  Volume:  Normal  Mood: fair   Affect: congruent  Thought Process:  Coherent, Linear and Logical  Orientation:  Full (Time, Place, and  Person)  Thought Content:  WDL  Suicidal Thoughts:  No  Homicidal Thoughts:  No  Memory:  Immediate;   Good Recent;   Fair Remote;   Fair  Judgement:  Good  Insight:  Good  Psychomotor Activity:  Normal  Concentration:  Negative  Recall:  Negative  Akathisia:  Negative  Handed:  Right  AIMS (if indicated): Not indicated  Assets:  Communication Skills Desire for Improvement Financial Resources/Insurance Housing Intimacy Leisure Time Physical Health Resilience Social Support Talents/Skills Transportation Vocational/Educational  Sleep:  Number of Hours: 7-8     Assessment: Axis I: Major depressive disorder, recurrent, moderate-stable. GAD . Panic disorder per history   Plan:   Plan of Care:  PLAN:  1. Affirm with the patient that the medications are taken as ordered. Patient  expressed understanding of how their medications were to be used.        Medications:  Continue the following psychiatric medications as written prior to this appointment with the following changes::  1. Depression:; baseline. Can divide lamictal if concern of headache or fatigue to bid 2. GAD: not wore. Continue lexapro. For now. Can change to cymbalta if needed according to gene results 3> mood disorder: baseline. Not irritabl.e continue lamictal  Questions addressed FU 2-3 months            Thresa RossNADEEM Aeliana Spates, M.D.  03/16/2017 1:25 PM               Patient ID: Lindsey Klein, female   DOB: 21-Oct-1955, 61 y.o.   MRN: [id number]

## 2017-04-12 ENCOUNTER — Telehealth (HOSPITAL_COMMUNITY): Payer: Self-pay

## 2017-04-12 NOTE — Telephone Encounter (Signed)
Patient called and left VM on medication line requesting a 90 supply for Lexapro and Wellbutrin. I left a VM and informed patient that the Lexapro was sent in on 02/08/17 for a 3 month supply and the Wellbutrin was sent in on 03/02/17 for a 90 day supply. Both were sent to the mail order pharmacy. Nothing further is needed at this time.

## 2017-06-07 ENCOUNTER — Ambulatory Visit (HOSPITAL_COMMUNITY): Payer: Self-pay | Admitting: Psychiatry

## 2017-06-20 ENCOUNTER — Ambulatory Visit (INDEPENDENT_AMBULATORY_CARE_PROVIDER_SITE_OTHER): Payer: Federal, State, Local not specified - PPO | Admitting: Psychiatry

## 2017-06-20 ENCOUNTER — Encounter (HOSPITAL_COMMUNITY): Payer: Self-pay | Admitting: Psychiatry

## 2017-06-20 VITALS — BP 128/88 | HR 77 | Ht 65.0 in | Wt 184.0 lb

## 2017-06-20 DIAGNOSIS — F411 Generalized anxiety disorder: Secondary | ICD-10-CM

## 2017-06-20 DIAGNOSIS — F331 Major depressive disorder, recurrent, moderate: Secondary | ICD-10-CM | POA: Diagnosis not present

## 2017-06-20 DIAGNOSIS — F410 Panic disorder [episodic paroxysmal anxiety] without agoraphobia: Secondary | ICD-10-CM | POA: Diagnosis not present

## 2017-06-20 DIAGNOSIS — Z87891 Personal history of nicotine dependence: Secondary | ICD-10-CM

## 2017-06-20 NOTE — Progress Notes (Signed)
Patient ID: Lindsey Klein, female   DOB: [birth year], 62 y.o.   MRN: [id number]   [facility] Follow-up Outpatient Visit  Lindsey Klein [birth year] [id number] 62 y.o. 06/20/2017 1:54 PM  Chief Complaint: Follow up  History of Present Illness: HPI Comments: Lindsey Klein is  a 62 y/o female with a past psychiatric history significant for Major Depressive Disorder, recurrent, severe without psychotic features, GAD and history of Panic disorder.  The patient returns  for psychiatric services and medication management.    Doing fair. On lexapro 20mg  instead of 30mg . Less agitated.  Some concern of food craving and working on it. Works 3rd shift   Energy better Sleep adequate  Duration more then 5 years Timing: when stressed Depression:: 7/10 . Modifying factors: night work family    Review of Systems: Review of Systems  Constitutional: Negative for fever.  Cardiovascular: Negative for chest pain.  Skin: Negative for rash.  Neurological: Negative for tingling and tremors.  Psychiatric/Behavioral: Negative for substance abuse and suicidal ideas.     Past Medical, Family, Social History:  Past Medical History:  Diagnosis Date  . ASCUS (atypical squamous cells of undetermined significance) on Pap smear 03/2006   NEG FOR HISH RISK HPV  . Depression   . Increased prolactin level (HCC) 01/2007   WAS 37-- RECHECK WAS 28, 16 in 2011  . LGSIL (low grade squamous intraepithelial dysplasia) 07/2009   C&B, ECC NEGATIVE  . Ovarian cyst, left 2014   Family History  Problem Relation Age of Onset  . Breast cancer Mother        metastized to liver  . Alcohol abuse Mother   . Depression Mother   . Breast cancer Father        testicular and then unknown later in life  . Bipolar disorder Sister    Social History   Socioeconomic History  . Marital status: Divorced    Spouse name: Not on file  . Number of children: Not on file  . Years of education: Not on file  .  Highest education level: Not on file  Occupational History  . Not on file  Social Needs  . Financial resource strain: Not on file  . Food insecurity:    Worry: Not on file    Inability: Not on file  . Transportation needs:    Medical: Not on file    Non-medical: Not on file  Tobacco Use  . Smoking status: Former Games developer  . Smokeless tobacco: Never Used  . Tobacco comment: quit 28 years ago  Substance and Sexual Activity  . Alcohol use: Yes    Alcohol/week: 0.6 oz    Types: 1 Glasses of wine per week    Comment: socially  . Drug use: No  . Sexual activity: Yes    Partners: Male    Birth control/protection: Post-menopausal  Lifestyle  . Physical activity:    Days per week: Not on file    Minutes per session: Not on file  . Stress: Not on file  Relationships  . Social connections:    Talks on phone: Not on file    Gets together: Not on file    Attends religious service: Not on file    Active member of club or organization: Not on file    Attends meetings of clubs or organizations: Not on file    Relationship status: Not on file  Other Topics Concern  . Not on file  Social History Narrative  .  Not on file   Outpatient Encounter Medications as of 06/20/2017  Medication Sig  . buPROPion (WELLBUTRIN SR) 100 MG 12 hr tablet Take 1 tablet (100 mg total) by mouth daily.  Marland Kitchen. escitalopram (LEXAPRO) 20 MG tablet Take 1 tablet (20 mg total) daily by mouth.  Marland Kitchen. ibuprofen (ADVIL,MOTRIN) 200 MG tablet Take 400 mg by mouth every 6 (six) hours as needed for moderate pain.  Marland Kitchen. lamoTRIgine (LAMICTAL) 150 MG tablet Take 1 tablet (150 mg total) daily by mouth.  . latanoprost (XALATAN) 0.005 % ophthalmic solution   . mometasone-formoterol (DULERA) 100-5 MCG/ACT AERO Inhale 2 puffs into the lungs 2 (two) times daily.   No facility-administered encounter medications on file as of 06/20/2017.      Physical Exam: Constitutional:  BP 128/88 (BP Location: Left Arm, Patient Position: Sitting,  Cuff Size: Normal)   Pulse 77   Ht 5\' 5"  (1.651 m)   Wt 184 lb (83.5 kg)   LMP 05/27/2010   BMI 30.62 kg/m   General Appearance: alert, oriented, no acute distress, well nourished and obese Musculoskeletal: Gait & Station: normal Patient leans: N/A   Psychiatric: General Appearance: Casual and Well Groomed  Patent attorneyye Contact::  Good  Speech:  Clear and Coherent and Normal Rate  Volume:  Normal  Mood: fair   Affect: congruent  Thought Process:  Coherent, Linear and Logical  Orientation:  Full (Time, Place, and Person)  Thought Content:  WDL  Suicidal Thoughts:  No  Homicidal Thoughts:  No  Memory:  Immediate;   Good Recent;   Fair Remote;   Fair  Judgement:  Good  Insight:  Good  Psychomotor Activity:  Normal  Concentration:  Negative  Recall:  Negative  Akathisia:  Negative  Handed:  Right  AIMS (if indicated): Not indicated  Assets:  Communication Skills Desire for Improvement Financial Resources/Insurance Housing Intimacy Leisure Time Physical Health Resilience Social Support Talents/Skills Transportation Vocational/Educational  Sleep:  Number of Hours: 7-8     Assessment: Axis I: Major depressive disorder, recurrent, moderate-stable. GAD . Panic disorder per history   Plan:   Plan of Care:  PLAN:  1. Affirm with the patient that the medications are taken as ordered. Patient  expressed understanding of how their medications were to be used.        Medications:  Continue the following psychiatric medications as written prior to this appointment with the following changes::  1. Depression:;baseline. Continue lexapro and lamictal. No rash 2. GAD: not worse. Continue lexapro 3> mood disorder: baseline. Not irritabl.e continue lamictal  Questions addressed FU 2-3 months Has meds. Can call for refills            Lindsey RossNADEEM Adit Riddles, M.D.  06/20/2017 1:54 PM               Patient ID: Lindsey Klein, female   DOB: Apr 16, 1955, 62 y.o.   MRN:  [id number]

## 2017-10-02 ENCOUNTER — Other Ambulatory Visit (HOSPITAL_COMMUNITY): Payer: Self-pay

## 2017-10-02 ENCOUNTER — Telehealth (HOSPITAL_COMMUNITY): Payer: Self-pay | Admitting: Psychiatry

## 2017-10-02 MED ORDER — ESCITALOPRAM OXALATE 20 MG PO TABS: 20.0000 mg | ORAL_TABLET | Freq: Every day | ORAL | 0 refills | Status: DC

## 2017-10-02 MED ORDER — LAMOTRIGINE 150 MG PO TABS: 150.0000 mg | ORAL_TABLET | Freq: Every day | ORAL | 0 refills | Status: DC

## 2017-10-02 NOTE — Telephone Encounter (Signed)
Sent in a 90 day supply of both Lamictal 150mg  and Lexapro 20mg . Left VM informing patient of medication sent.

## 2017-10-02 NOTE — Telephone Encounter (Signed)
Pt left VM requesting 90 day supply of lamictal and lexapro be sent to [facility]

## 2017-10-03 ENCOUNTER — Other Ambulatory Visit (HOSPITAL_COMMUNITY): Payer: Self-pay

## 2017-10-03 ENCOUNTER — Telehealth (HOSPITAL_COMMUNITY): Payer: Self-pay

## 2017-10-03 MED ORDER — LAMOTRIGINE 150 MG PO TABS: 150.0000 mg | ORAL_TABLET | Freq: Every day | ORAL | 0 refills | Status: DC

## 2017-10-03 MED ORDER — ESCITALOPRAM OXALATE 20 MG PO TABS: 20.0000 mg | ORAL_TABLET | Freq: Every day | ORAL | 0 refills | Status: DC

## 2017-10-03 NOTE — Telephone Encounter (Signed)
Patient needed enough medication to last until mail order came. Sent in 10 tablet of both Lamictal and Lexapro to [facility].

## 2017-10-04 ENCOUNTER — Ambulatory Visit (HOSPITAL_COMMUNITY): Payer: Self-pay | Admitting: Psychiatry

## 2017-10-25 ENCOUNTER — Ambulatory Visit (HOSPITAL_COMMUNITY): Payer: Federal, State, Local not specified - PPO | Admitting: Psychiatry

## 2017-10-25 ENCOUNTER — Other Ambulatory Visit: Payer: Self-pay

## 2017-10-25 ENCOUNTER — Encounter (HOSPITAL_COMMUNITY): Payer: Self-pay | Admitting: Psychiatry

## 2017-10-25 VITALS — BP 138/88 | HR 75 | Ht 65.0 in | Wt 188.0 lb

## 2017-10-25 DIAGNOSIS — F410 Panic disorder [episodic paroxysmal anxiety] without agoraphobia: Secondary | ICD-10-CM

## 2017-10-25 DIAGNOSIS — F411 Generalized anxiety disorder: Secondary | ICD-10-CM

## 2017-10-25 DIAGNOSIS — F331 Major depressive disorder, recurrent, moderate: Secondary | ICD-10-CM

## 2017-10-25 MED ORDER — ESCITALOPRAM OXALATE 20 MG PO TABS: 30.0000 mg | ORAL_TABLET | Freq: Every day | ORAL | 0 refills | Status: DC

## 2017-10-25 NOTE — Progress Notes (Signed)
Patient ID: [name], female   DOB: March 23, 1956, 62 y.o.   MRN: [id number]   [facility] Follow-up Outpatient Visit  SAESHA LLERENAS 1956/02/16 [id number] 62 y.o. 10/25/2017 1:35 PM  Chief Complaint: Follow up  History of Present Illness: HPI Comments: Ms. Boardman is  a 62 y/o female with a past psychiatric history significant for Major Depressive Disorder, recurrent, severe without psychotic features, GAD and history of Panic disorder.  The patient returns  for psychiatric services and medication management.   Doing fair. Doing night shift. Taking 30mg  lexapro instead of 20. It works better Not agitated No rash Visited brother [place]   Energy still low at times  Sleep adequate  Duration more then 5 years Timing: when stressed Depression:: 7/10 . Modifying factors: night work family    Review of Systems: Review of Systems  Constitutional: Negative for fever.  Cardiovascular: Negative for chest pain.  Skin: Negative for rash.  Neurological: Negative for tingling and tremors.  Psychiatric/Behavioral: Negative for depression, substance abuse and suicidal ideas.     Past Medical, Family, Social History:  Past Medical History:  Diagnosis Date  . ASCUS (atypical squamous cells of undetermined significance) on Pap smear 03/2006   NEG FOR HISH RISK HPV  . Depression   . Increased prolactin level (HCC) 01/2007   WAS 37-- RECHECK WAS 28, 16 in 2011  . LGSIL (low grade squamous intraepithelial dysplasia) 07/2009   C&B, ECC NEGATIVE  . Ovarian cyst, left 2014   Family History  Problem Relation Age of Onset  . Breast cancer Mother        metastized to liver  . Alcohol abuse Mother   . Depression Mother   . Breast cancer Father        testicular and then unknown later in life  . Bipolar disorder Sister    Social History   Socioeconomic History  . Marital status: Divorced    Spouse name: Not on file  . Number of children: Not on file  . Years of  education: Not on file  . Highest education level: Not on file  Occupational History  . Not on file  Social Needs  . Financial resource strain: Not on file  . Food insecurity:    Worry: Not on file    Inability: Not on file  . Transportation needs:    Medical: Not on file    Non-medical: Not on file  Tobacco Use  . Smoking status: Former Games developer  . Smokeless tobacco: Never Used  . Tobacco comment: quit 28 years ago  Substance and Sexual Activity  . Alcohol use: Yes    Alcohol/week: 0.6 oz    Types: 1 Glasses of wine per week    Comment: socially  . Drug use: No  . Sexual activity: Yes    Partners: Male    Birth control/protection: Post-menopausal  Lifestyle  . Physical activity:    Days per week: Not on file    Minutes per session: Not on file  . Stress: Not on file  Relationships  . Social connections:    Talks on phone: Not on file    Gets together: Not on file    Attends religious service: Not on file    Active member of club or organization: Not on file    Attends meetings of clubs or organizations: Not on file    Relationship status: Not on file  Other Topics Concern  . Not on file  Social History Narrative  .  Not on file   Outpatient Encounter Medications as of 10/25/2017  Medication Sig  . buPROPion (WELLBUTRIN SR) 100 MG 12 hr tablet Take 1 tablet (100 mg total) by mouth daily.  Marland Kitchen. escitalopram (LEXAPRO) 20 MG tablet Take 1.5 tablets (30 mg total) by mouth daily.  Marland Kitchen. ibuprofen (ADVIL,MOTRIN) 200 MG tablet Take 400 mg by mouth every 6 (six) hours as needed for moderate pain.  Marland Kitchen. lamoTRIgine (LAMICTAL) 150 MG tablet Take 1 tablet (150 mg total) by mouth daily.  Marland Kitchen. latanoprost (XALATAN) 0.005 % ophthalmic solution   . mometasone-formoterol (DULERA) 100-5 MCG/ACT AERO Inhale 2 puffs into the lungs 2 (two) times daily.  . [DISCONTINUED] escitalopram (LEXAPRO) 20 MG tablet Take 1 tablet (20 mg total) by mouth daily.   No facility-administered encounter medications on  file as of 10/25/2017.      Physical Exam: Constitutional:  BP 138/88 (BP Location: Left Arm, Patient Position: Sitting, Cuff Size: Normal)   Pulse 75   Ht 5\' 5"  (1.651 m)   Wt 188 lb (85.3 kg)   LMP 05/27/2010   BMI 31.28 kg/m   General Appearance: alert, oriented, no acute distress, well nourished and obese Musculoskeletal: Gait & Station: normal Patient leans: N/A   Psychiatric: General Appearance: Casual and Well Groomed  Patent attorneyye Contact::  Good  Speech:  Clear and Coherent and Normal Rate  Volume:  Normal  Mood:fair   Affect: congruent  Thought Process:  Coherent, Linear and Logical  Orientation:  Full (Time, Place, and Person)  Thought Content:  WDL  Suicidal Thoughts:  No  Homicidal Thoughts:  No  Memory:  Immediate;   Good Recent;   Fair Remote;   Fair  Judgement:  Good  Insight:  Good  Psychomotor Activity:  Normal  Concentration:  Negative  Recall:  Negative  Akathisia:  Negative  Handed:  Right  AIMS (if indicated): Not indicated  Assets:  Communication Skills Desire for Improvement Financial Resources/Insurance Housing Intimacy Leisure Time Physical Health Resilience Social Support Talents/Skills Transportation Vocational/Educational  Sleep:  Number of Hours: 7-8     Assessment: Axis I: Major depressive disorder, recurrent, moderate-stable. GAD . Panic disorder per history   Plan:   Plan of Care:  PLAN:  1. Affirm with the patient that the medications are taken as ordered. Patient  expressed understanding of how their medications were to be used.        Medications:  Continue the following psychiatric medications as written prior to this appointment with the following changes::  1. Depression:baseline. Continue meds. Now lexapro 30mg  so will have refill of 135 tablets when she calls for refill 2. GAD: not worse. Continue lexapro 3> mood disorder: baseline. Not irritabl.e continue lamictal  Questions addressed FU 2-3 months Has meds.  Can call for refills            Thresa RossNADEEM Samari Gorby, M.D.  10/25/2017 1:35 PM               Patient ID: Donaciano EvaMary A Gadea, female   DOB: Mar 16, 1956, 62 y.o.   MRN: [id number]

## 2017-12-04 ENCOUNTER — Other Ambulatory Visit (HOSPITAL_COMMUNITY): Payer: Self-pay

## 2017-12-04 MED ORDER — BUPROPION HCL ER (SR) 100 MG PO TB12: 100.0000 mg | ORAL_TABLET | Freq: Every day | ORAL | 0 refills | Status: DC

## 2017-12-27 ENCOUNTER — Other Ambulatory Visit (HOSPITAL_COMMUNITY): Payer: Self-pay

## 2017-12-27 MED ORDER — ESCITALOPRAM OXALATE 20 MG PO TABS: 30.0000 mg | ORAL_TABLET | Freq: Every day | ORAL | 0 refills | Status: AC

## 2017-12-27 MED ORDER — ESCITALOPRAM OXALATE 20 MG PO TABS: 30.0000 mg | ORAL_TABLET | Freq: Every day | ORAL | 0 refills | Status: DC

## 2018-01-24 ENCOUNTER — Ambulatory Visit (HOSPITAL_COMMUNITY): Payer: Self-pay | Admitting: Psychiatry

## 2018-01-26 DIAGNOSIS — M47816 Spondylosis without myelopathy or radiculopathy, lumbar region: Secondary | ICD-10-CM | POA: Insufficient documentation

## 2018-01-26 DIAGNOSIS — M4310 Spondylolisthesis, site unspecified: Secondary | ICD-10-CM | POA: Insufficient documentation

## 2018-01-26 DIAGNOSIS — M5136 Other intervertebral disc degeneration, lumbar region: Secondary | ICD-10-CM | POA: Insufficient documentation

## 2018-02-07 ENCOUNTER — Other Ambulatory Visit (HOSPITAL_COMMUNITY): Payer: Self-pay

## 2018-02-07 MED ORDER — LAMOTRIGINE 150 MG PO TABS: 150.0000 mg | ORAL_TABLET | Freq: Every day | ORAL | 0 refills | Status: DC

## 2018-02-14 ENCOUNTER — Other Ambulatory Visit: Payer: Self-pay

## 2018-02-14 ENCOUNTER — Ambulatory Visit (HOSPITAL_COMMUNITY): Payer: Federal, State, Local not specified - PPO | Admitting: Psychiatry

## 2018-02-14 ENCOUNTER — Encounter (HOSPITAL_COMMUNITY): Payer: Self-pay | Admitting: Psychiatry

## 2018-02-14 VITALS — BP 138/88 | HR 85 | Ht 65.0 in | Wt 187.0 lb

## 2018-02-14 DIAGNOSIS — F331 Major depressive disorder, recurrent, moderate: Secondary | ICD-10-CM | POA: Diagnosis not present

## 2018-02-14 DIAGNOSIS — F410 Panic disorder [episodic paroxysmal anxiety] without agoraphobia: Secondary | ICD-10-CM | POA: Diagnosis not present

## 2018-02-14 DIAGNOSIS — F411 Generalized anxiety disorder: Secondary | ICD-10-CM

## 2018-02-14 NOTE — Progress Notes (Signed)
Patient ID: Lindsey Klein, female   DOB: [birth year], 62 y.o.   MRN: [id number]   [place] Follow-up Outpatient Visit  Lindsey Klein [birth year] [id number] 62 y.o. 02/14/2018 1:19 PM  Chief Complaint: Follow up  History of Present Illness: HPI Comments: Lindsey Klein is  a 62 y/o female with a past psychiatric history significant for Major Depressive Disorder, recurrent, severe without psychotic features, GAD and history of Panic disorder.  The patient returns  for psychiatric services and medication management.   Doing fair. Tolerating meds Friend has Multiple Myeloma. Its stressful Likes the night shift   Not agitated No rash   Duration more then 5 years Timing: when stressed Depression:: 7/10 . Modifying factors: night work family    Review of Systems: Review of Systems  Constitutional: Negative for fever.  Cardiovascular: Negative for palpitations.  Skin: Negative for rash.  Neurological: Negative for tingling and tremors.  Psychiatric/Behavioral: Negative for depression, substance abuse and suicidal ideas.     Past Medical, Family, Social History:  Past Medical History:  Diagnosis Date  . ASCUS (atypical squamous cells of undetermined significance) on Pap smear 03/2006   NEG FOR HISH RISK HPV  . Depression   . Increased prolactin level ([place]) 01/2007   WAS 37-- RECHECK WAS 28, 16 in 2011  . LGSIL (low grade squamous intraepithelial dysplasia) 07/2009   C&B, ECC NEGATIVE  . Ovarian cyst, left 2014   Family History  Problem Relation Age of Onset  . Breast cancer Mother        metastized to liver  . Alcohol abuse Mother   . Depression Mother   . Breast cancer Father        testicular and then unknown later in life  . Bipolar disorder Sister    Social History   Socioeconomic History  . Marital status: Divorced    Spouse name: Not on file  . Number of children: Not on file  . Years of education: Not on file  . Highest education level:  Not on file  Occupational History  . Not on file  Social Needs  . Financial resource strain: Not on file  . Food insecurity:    Worry: Not on file    Inability: Not on file  . Transportation needs:    Medical: Not on file    Non-medical: Not on file  Tobacco Use  . Smoking status: Former Research scientist (life sciences)  . Smokeless tobacco: Never Used  . Tobacco comment: quit 28 years ago  Substance and Sexual Activity  . Alcohol use: Yes    Alcohol/week: 1.0 standard drinks    Types: 1 Glasses of wine per week    Comment: socially  . Drug use: No  . Sexual activity: Yes    Partners: Male    Birth control/protection: Post-menopausal  Lifestyle  . Physical activity:    Days per week: Not on file    Minutes per session: Not on file  . Stress: Not on file  Relationships  . Social connections:    Talks on phone: Not on file    Gets together: Not on file    Attends religious service: Not on file    Active member of club or organization: Not on file    Attends meetings of clubs or organizations: Not on file    Relationship status: Not on file  Other Topics Concern  . Not on file  Social History Narrative  . Not on file   Outpatient Encounter Medications  as of 02/14/2018  Medication Sig  . buPROPion (WELLBUTRIN SR) 100 MG 12 hr tablet Take 1 tablet (100 mg total) by mouth daily.  Marland Kitchen escitalopram (LEXAPRO) 20 MG tablet Take 1.5 tablets (30 mg total) by mouth daily.  Marland Kitchen ibuprofen (ADVIL,MOTRIN) 200 MG tablet Take 400 mg by mouth every 6 (six) hours as needed for moderate pain.  Marland Kitchen lamoTRIgine (LAMICTAL) 150 MG tablet Take 1 tablet (150 mg total) by mouth daily.  Marland Kitchen latanoprost (XALATAN) 0.005 % ophthalmic solution   . mometasone-formoterol (DULERA) 100-5 MCG/ACT AERO Inhale 2 puffs into the lungs 2 (two) times daily.   No facility-administered encounter medications on file as of 02/14/2018.      Physical Exam: Constitutional:  BP 138/88 (BP Location: Left Arm, Patient Position: Sitting, Cuff Size:  Normal)   Pulse 85   Ht '5\' 5"'  (1.651 m)   Wt 187 lb (84.8 kg)   LMP 05/27/2010   BMI 31.12 kg/m   General Appearance: alert, oriented, no acute distress, well nourished and obese Musculoskeletal: Gait & Station: normal Patient leans: N/A   Psychiatric: General Appearance: Casual and Well Groomed  Engineer, water::  Good  Speech:  Clear and Coherent and Normal Rate  Volume:  Normal  Mood: fair  Affect: congruent  Thought Process:  Coherent, Linear and Logical  Orientation:  Full (Time, Place, and Person)  Thought Content:  WDL  Suicidal Thoughts:  No  Homicidal Thoughts:  No  Memory:  Immediate;   Good Recent;   Fair Remote;   Fair  Judgement:  Good  Insight:  Good  Psychomotor Activity:  Normal  Concentration:  Negative  Recall:  Negative  Akathisia:  Negative  Handed:  Right  AIMS (if indicated): Not indicated  Assets:  Communication Skills Desire for Improvement Financial Resources/Insurance Housing Intimacy Leisure Time Physical Health Resilience Social Support Talents/Skills Transportation Vocational/Educational  Sleep:  Number of Hours: 7-8     Assessment: Axis I: Major depressive disorder, recurrent, moderate-stable. GAD . Panic disorder per history   Plan:   Plan of Care:  PLAN:  1. Affirm with the patient that the medications are taken as ordered. Patient  expressed understanding of how their medications were to be used.        Medications:  Continue the following psychiatric medications as written prior to this appointment with the following changes::  1. Depression: fair. Continue lamictal, wellbutrin 2. GAD: not worse. Continue lexapro 3> mood disorder: baseline. Not irritabl.e continue lamictal  Questions addressed FU 2-3 months Has meds. Can call for refills            Lindsey Klein, M.D.  02/14/2018 1:19 PM               Patient ID: Lindsey Klein, female   DOB: 26-Aug-1955, 62 y.o.   MRN: [id number]

## 2018-03-26 ENCOUNTER — Other Ambulatory Visit (HOSPITAL_COMMUNITY): Payer: Self-pay

## 2018-03-26 MED ORDER — LAMOTRIGINE 150 MG PO TABS: 150.0000 mg | ORAL_TABLET | Freq: Every day | ORAL | 0 refills | Status: AC

## 2018-03-26 MED ORDER — BUPROPION HCL ER (SR) 100 MG PO TB12: 100.0000 mg | ORAL_TABLET | Freq: Every day | ORAL | 0 refills | Status: AC

## 2018-05-09 ENCOUNTER — Ambulatory Visit (HOSPITAL_COMMUNITY): Payer: Federal, State, Local not specified - PPO | Admitting: Psychiatry

## 2018-09-19 ENCOUNTER — Ambulatory Visit: Payer: Federal, State, Local not specified - PPO | Admitting: Adult Health

## 2018-10-05 ENCOUNTER — Other Ambulatory Visit: Payer: Self-pay | Admitting: Family Medicine

## 2018-10-05 DIAGNOSIS — Z1231 Encounter for screening mammogram for malignant neoplasm of breast: Secondary | ICD-10-CM

## 2018-11-28 ENCOUNTER — Ambulatory Visit
Admission: RE | Admit: 2018-11-28 | Discharge: 2018-11-28 | Disposition: A | Discharge status: Home / Self Care | Payer: Federal, State, Local not specified - PPO | Source: Ambulatory Visit | Attending: Family Medicine | Admitting: Family Medicine

## 2018-11-28 ENCOUNTER — Other Ambulatory Visit: Payer: Self-pay

## 2018-11-28 DIAGNOSIS — Z1231 Encounter for screening mammogram for malignant neoplasm of breast: Secondary | ICD-10-CM

## 2019-03-12 DIAGNOSIS — I701 Atherosclerosis of renal artery: Secondary | ICD-10-CM | POA: Insufficient documentation

## 2019-03-12 DIAGNOSIS — E785 Hyperlipidemia, unspecified: Secondary | ICD-10-CM | POA: Insufficient documentation

## 2019-03-15 ENCOUNTER — Ambulatory Visit
Admission: EM | Admit: 2019-03-15 | Discharge: 2019-03-15 | Disposition: A | Discharge status: Home / Self Care | Payer: Federal, State, Local not specified - PPO

## 2019-03-15 ENCOUNTER — Other Ambulatory Visit: Payer: Self-pay

## 2019-03-15 DIAGNOSIS — W010XXA Fall on same level from slipping, tripping and stumbling without subsequent striking against object, initial encounter: Secondary | ICD-10-CM

## 2019-03-15 DIAGNOSIS — S0083XA Contusion of other part of head, initial encounter: Secondary | ICD-10-CM

## 2019-03-15 HISTORY — DX: COVID-19: U07.1

## 2019-03-15 NOTE — ED Provider Notes (Signed)
[facility]    CSN: [id number] Arrival date & time: 03/15/19  1053      History   Chief Complaint Chief Complaint  Patient presents with  . Facial Injury    HPI Lindsey Klein is a 63 y.o. female.   The history is provided by the patient. No language interpreter was used.  Facial Injury Mechanism of injury:  Fall Location:  Forehead and nose Pain details:    Quality:  Aching   Timing:  Constant   Progression:  Worsening Relieved by:  Nothing Worsened by:  Nothing Associated symptoms: no altered mental status and no double vision   Pt reports she tripped and fell and hit her forehead and her nose.  No loss of consciousness   Past Medical History:  Diagnosis Date  . ASCUS (atypical squamous cells of undetermined significance) on Pap smear 03/2006   NEG FOR HISH RISK HPV  . COVID-19   . Depression   . Increased prolactin level 01/2007   WAS 37-- RECHECK WAS 28, 16 in 2011  . LGSIL (low grade squamous intraepithelial dysplasia) 07/2009   C&B, ECC NEGATIVE  . Ovarian cyst, left 2014    Patient Active Problem List   Diagnosis Date Noted  . Major depressive disorder, recurrent episode, severe, without mention of psychotic behavior 02/16/2013  . ASCUS (atypical squamous cells of undetermined significance) on Pap smear   . Increased prolactin level   . LGSIL (low grade squamous intraepithelial dysplasia)   . Depression   . DEPRESSION 05/28/2010  . CALCIUM DEPOSITS IN TENDON AND BURSA 05/28/2010    Past Surgical History:  Procedure Laterality Date  . CESAREAN SECTION  1986  . COLPOSCOPY  05/11    LGSIL--C&B-ECC NEG  . FOOT SURGERY  2007   BILATERAL  . NASAL SEPTUM SURGERY    . POLYPECTOMY    . ROTATOR CUFF REPAIR  2009   LEFT  . TONSILLECTOMY AND ADENOIDECTOMY      OB History    Gravida  5   Para  1   Term      Preterm      AB  4   Living  1     SAB  2   TAB  2   Ectopic      Multiple      Live Births                Home Medications    Prior to Admission medications   Medication Sig Start Date End Date Taking? Authorizing Provider  buPROPion (WELLBUTRIN SR) 100 MG 12 hr tablet Take 1 tablet (100 mg total) by mouth daily. 03/26/18   Thresa Ross, MD  escitalopram (LEXAPRO) 20 MG tablet Take 1.5 tablets (30 mg total) by mouth daily. 12/27/17   Thresa Ross, MD  ibuprofen (ADVIL,MOTRIN) 200 MG tablet Take 400 mg by mouth every 6 (six) hours as needed for moderate pain.    [provider]  lamoTRIgine (LAMICTAL) 150 MG tablet Take 1 tablet (150 mg total) by mouth daily. 03/26/18   Thresa Ross, MD  latanoprost (XALATAN) 0.005 % ophthalmic solution     [provider]  mometasone-formoterol (DULERA) 100-5 MCG/ACT AERO Inhale 2 puffs into the lungs 2 (two) times daily.    [provider]    Family History Family History  Problem Relation Age of Onset  . Breast cancer Mother        metastized to liver  . Alcohol abuse Mother   .  Depression Mother   . Breast cancer Father        testicular and then unknown later in life  . Bipolar disorder Sister     Social History Social History   Tobacco Use  . Smoking status: Former Games developermoker  . Smokeless tobacco: Never Used  . Tobacco comment: quit 28 years ago  Substance Use Topics  . Alcohol use: Yes    Alcohol/week: 1.0 standard drinks    Types: 1 Glasses of wine per week    Comment: socially  . Drug use: No     Allergies   Penicillins   Review of Systems Review of Systems  Eyes: Negative for double vision.  All other systems reviewed and are negative.    Physical Exam Triage Vital Signs ED Triage Vitals  Enc Vitals Group     BP 03/15/19 1110 (!) 185/77     Pulse Rate 03/15/19 1110 73     Resp 03/15/19 1110 18     Temp 03/15/19 1110 98 F (36.7 C)     Temp Source 03/15/19 1110 Oral     SpO2 03/15/19 1110 97 %     Weight --      Height --      Head Circumference --      Peak Flow --      Pain  Score 03/15/19 1111 6     Pain Loc --      Pain Edu? --      Excl. in GC? --    No data found.  Updated Vital Signs BP (!) 160/77 (BP Location: Left Arm)   Pulse 73   Temp 98 F (36.7 C) (Oral)   Resp 18   LMP 05/27/2010   SpO2 97%   Visual Acuity Right Eye Distance:   Left Eye Distance:   Bilateral Distance:    Right Eye Near:   Left Eye Near:    Bilateral Near:     Physical Exam Vitals and nursing note reviewed.  Constitutional:      Appearance: She is well-developed.  HENT:     Head: Normocephalic.     Comments: Tender bruised left forehead, minimal tenderness    Nose:     Comments: Bruised tender upper nose Eyes:     Extraocular Movements: Extraocular movements intact.     Pupils: Pupils are equal, round, and reactive to light.  Cardiovascular:     Rate and Rhythm: Normal rate.  Pulmonary:     Effort: Pulmonary effort is normal.  Abdominal:     General: There is no distension.  Musculoskeletal:        General: Normal range of motion.     Cervical back: Normal range of motion.  Neurological:     Mental Status: She is alert and oriented to person, place, and time.      UC Treatments / Results  Labs (all labs ordered are listed, but only abnormal results are displayed) Labs Reviewed - No data to display  EKG   Radiology No results found.  Procedures Procedures (including critical care time)  Medications Ordered in UC Medications - No data to display  Initial Impression / Assessment and Plan / UC Course  I have reviewed the triage vital signs and the nursing notes.  Pertinent labs & imaging results that were available during my care of the patient were reviewed by me and considered in my medical decision making (see chart for details).     MDM  I doubt fracture,  no sign of a brain injury.   Final Clinical Impressions(s) / UC Diagnoses   Final diagnoses:  Contusion of face, initial encounter     Discharge Instructions     Tylenol  for pain.  Ice to areas of swelling.  Go to the Emergency department if symptoms worsen    ED Prescriptions    None     PDMP not reviewed this encounter.  An After Visit Summary was printed and given to the patient.    Fransico Meadow, [place] 03/15/19 1840

## 2019-03-15 NOTE — ED Triage Notes (Signed)
Pt states at work this morning 5am, tripped and face planted on concrete. Bruising and swelling over lt eye, swelling and abrasion to bridge of nose. States had nose bleed after fall. EMS came out to evaluate at the time of accident. Denies LOC

## 2019-03-15 NOTE — Discharge Instructions (Addendum)
Tylenol for pain.  Ice to areas of swelling.  Go to the Emergency department if symptoms worsen

## 2019-03-27 DIAGNOSIS — R55 Syncope and collapse: Secondary | ICD-10-CM | POA: Insufficient documentation

## 2019-07-05 DIAGNOSIS — R42 Dizziness and giddiness: Secondary | ICD-10-CM | POA: Insufficient documentation

## 2019-07-05 DIAGNOSIS — R251 Tremor, unspecified: Secondary | ICD-10-CM | POA: Insufficient documentation

## 2019-07-05 DIAGNOSIS — R7303 Prediabetes: Secondary | ICD-10-CM | POA: Insufficient documentation

## 2019-11-11 ENCOUNTER — Other Ambulatory Visit: Payer: Self-pay | Admitting: Nephrology

## 2019-11-11 DIAGNOSIS — I701 Atherosclerosis of renal artery: Secondary | ICD-10-CM

## 2019-11-11 DIAGNOSIS — I10 Essential (primary) hypertension: Secondary | ICD-10-CM

## 2019-11-21 ENCOUNTER — Ambulatory Visit
Admission: RE | Admit: 2019-11-21 | Discharge: 2019-11-21 | Disposition: A | Discharge status: Home / Self Care | Payer: Federal, State, Local not specified - PPO | Source: Ambulatory Visit | Attending: Nephrology | Admitting: Nephrology

## 2019-11-21 DIAGNOSIS — I10 Essential (primary) hypertension: Secondary | ICD-10-CM

## 2019-11-21 DIAGNOSIS — I701 Atherosclerosis of renal artery: Secondary | ICD-10-CM

## 2019-11-21 MED ORDER — IOPAMIDOL (ISOVUE-370) INJECTION 76%
75.0000 mL | Freq: Once | INTRAVENOUS | Status: AC | PRN
  Administered 2019-11-21: 75 mL via INTRAVENOUS

## 2019-12-12 ENCOUNTER — Telehealth: Payer: Self-pay | Admitting: Hematology

## 2019-12-12 NOTE — Telephone Encounter (Signed)
Received a new pt referral from Dr. Thedore Mins at [facility] for splenic mass. Lindsey Klein returned my call and has been scheduled to see Dr. Candise Che on 9/22 at 11am. Pt aware to arrive 15 minutes early.

## 2019-12-18 ENCOUNTER — Inpatient Hospital Stay: Payer: Federal, State, Local not specified - PPO

## 2019-12-18 ENCOUNTER — Other Ambulatory Visit: Payer: Self-pay

## 2019-12-18 ENCOUNTER — Inpatient Hospital Stay: Payer: Federal, State, Local not specified - PPO | Attending: Hematology | Admitting: Hematology

## 2019-12-18 VITALS — BP 155/81 | HR 85 | Temp 97.0°F | Resp 18 | Ht 65.0 in | Wt 186.2 lb

## 2019-12-18 DIAGNOSIS — Z87891 Personal history of nicotine dependence: Secondary | ICD-10-CM | POA: Diagnosis not present

## 2019-12-18 DIAGNOSIS — Z808 Family history of malignant neoplasm of other organs or systems: Secondary | ICD-10-CM | POA: Diagnosis not present

## 2019-12-18 DIAGNOSIS — I701 Atherosclerosis of renal artery: Secondary | ICD-10-CM | POA: Diagnosis not present

## 2019-12-18 DIAGNOSIS — D739 Disease of spleen, unspecified: Secondary | ICD-10-CM | POA: Insufficient documentation

## 2019-12-18 DIAGNOSIS — Z8616 Personal history of COVID-19: Secondary | ICD-10-CM | POA: Insufficient documentation

## 2019-12-18 DIAGNOSIS — Z803 Family history of malignant neoplasm of breast: Secondary | ICD-10-CM | POA: Insufficient documentation

## 2019-12-18 DIAGNOSIS — D7389 Other diseases of spleen: Secondary | ICD-10-CM

## 2019-12-18 LAB — CMP (CANCER CENTER ONLY)
ALT: 43 U/L (ref 0–44)
AST: 32 U/L (ref 15–41)
Albumin: 4 g/dL (ref 3.5–5.0)
Alkaline Phosphatase: 102 U/L (ref 38–126)
Anion gap: 6 (ref 5–15)
BUN: 12 mg/dL (ref 8–23)
CO2: 32 mmol/L (ref 22–32)
Calcium: 10.2 mg/dL (ref 8.9–10.3)
Chloride: 103 mmol/L (ref 98–111)
Creatinine: 0.72 mg/dL (ref 0.44–1.00)
GFR, Est AFR Am: >60 mL/min (ref >60)
GFR, Estimated: >60 mL/min (ref >60)
Glucose, Bld: 100 mg/dL — ABNORMAL HIGH (ref 70–99)
Potassium: 4.3 mmol/L (ref 3.5–5.1)
Sodium: 141 mmol/L (ref 135–145)
Total Bilirubin: 0.3 mg/dL (ref 0.3–1.2)
Total Protein: 7.6 g/dL (ref 6.5–8.1)

## 2019-12-18 LAB — CBC WITH DIFFERENTIAL/PLATELET
Abs Immature Granulocytes: 0.01 10*3/uL (ref 0.00–0.07)
Basophils Absolute: 0.1 10*3/uL (ref 0.0–0.1)
Basophils Relative: 1 %
Eosinophils Absolute: 0.3 10*3/uL (ref 0.0–0.5)
Eosinophils Relative: 4 %
HCT: 42.1 % (ref 36.0–46.0)
Hemoglobin: 13.2 g/dL (ref 12.0–15.0)
Immature Granulocytes: 0 %
Lymphocytes Relative: 27 %
Lymphs Abs: 1.6 10*3/uL (ref 0.7–4.0)
MCH: 26.9 pg (ref 26.0–34.0)
MCHC: 31.4 g/dL (ref 30.0–36.0)
MCV: 85.7 fL (ref 80.0–100.0)
Monocytes Absolute: 0.6 10*3/uL (ref 0.1–1.0)
Monocytes Relative: 11 %
Neutro Abs: 3.4 10*3/uL (ref 1.7–7.7)
Neutrophils Relative %: 57 %
Platelets: 249 10*3/uL (ref 150–400)
RBC: 4.91 MIL/uL (ref 3.87–5.11)
RDW: 13.2 % (ref 11.5–15.5)
WBC: 5.9 10*3/uL (ref 4.0–10.5)
nRBC: 0 % (ref 0.0–0.2)

## 2019-12-18 LAB — LACTATE DEHYDROGENASE: LDH: 168 U/L (ref 98–192)

## 2019-12-18 LAB — HIV ANTIBODY (ROUTINE TESTING W REFLEX): HIV Screen 4th Generation wRfx: NONREACTIVE

## 2019-12-18 LAB — HEPATITIS C ANTIBODY: HCV Ab: NONREACTIVE

## 2019-12-18 NOTE — Progress Notes (Signed)
HEMATOLOGY/ONCOLOGY CONSULTATION NOTE  Date of Service: 12/18/2019  Patient Care Team: Patient, No Pcp Per as PCP - General (General Practice)  CHIEF COMPLAINTS/PURPOSE OF CONSULTATION:  Splenic mass  HISTORY OF PRESENTING ILLNESS:   Lindsey Klein is a wonderful 64 y.o. female who has been referred to us by Dr. Thedore MinsSingh for evaluation and management of splenic mass. The pt reports that she is doing well overall.   The pt reports that she contracted COVID19 in May of 2020. As a part of lung monitoring she was given a CT Angio Pulmonary in December of 2020. This scan revealed a splenic mass, which triggered a repeat abdominal scan in August.   Pt was sent to [facility] due to right renal artery stenosis visualized on 02/2019 CT Angio. She does not have history of HTN. Pt was started on Amlodipine 2.5 mg a few weeks ago.   Pt denies any abdominal pain or unexpected weight loss. Pt has not received the COVID19 vaccine. She is concerned about the recurrence of symptoms.   Of note prior to the patient's visit today, pt has had CT Angio Abd ([id number]) completed on 11/21/2019 with results revealing "1. Stenosis at the right renal artery origin. Degree of stenosis measures greater than 60%. Based on the location and configuration of the narrowing, suspect this stenosis is related to median arcuate ligament compression rather than atherosclerotic disease. Right renal artery stenosis could be hemodynamically significant based on the slight size discrepancy in the kidneys and asymmetric enhancement to the kidneys. Recommend vascular surgery consultation. 2. Concern for a small intimal abnormality at the origin of theceliac artery. A tiny dissection in this area cannot be excluded. Otherwise, the celiac artery is widely patent. 3. No significant atherosclerotic disease in the abdominal aorta. Mild atherosclerotic disease involving the iliac arteries." "1. An incidental finding of potential clinical  significance has been found. Evidence for a solid lesion or mass involving the superior aspect of the spleen measuring up to 5.6 cm. This lesion is relatively homogeneous on the delayed images and this lesion is indeterminate. In addition, there is a small exophytic lesion along the inferior aspect of spleen measuring roughly 0.9 cm. Recommend further characterization with abdominal MRI, with and without contrast. 2. Small hiatal hernia.3. Nonobstructive right kidney stone. 4. Degenerative changes in the lumbar spine as described. 5. Tiny pulmonary nodules at the left lung base. These small nodules are indeterminate."   Pt has had CT Angio Pulmonary completed on 03/04/2019 with results revealing "No evidence of pulmonary embolus. Small hiatal hernia. Stable splenic mass, likely benign.  Proximal right renal artery stenosis."  Pt has had MRI Abdomen completed on 02/28/2013 with results revealing "Abnormality along the superior aspect of the spleen is again appreciated. I suspect this is likely benign."  Pt has had CT Abdomen completed on 02/01/2013 with results revealing "1. No renal calculi. No evidence of a renal mass. There is a small  extrarenal pelvis in the left kidney which contributed to the suspected mild hydronephrosis on recent ultrasound. Delayed imaging was not  obtained through the ureters. 2. There is an approximately 5.3 cm x 4.9 cm area of rounded enhancement in the superior aspect of the spleen which is only visualized on the arterial phase images. Most splenic lesions are benign in nature. This could be further evaluated with abdominal MRI. 3. There is a 2.8 cm cyst in the left adnexa."  On review of systems, pt denies leg swelling, fevers, chills, night sweats,  rash, SOB, unexpected weight loss, abdominal pain and any other symptoms.   On PMHx the pt reports Arthritis, COVID19, Renal Artery Stenosis On Social Hx the pt reports that she is a previous smoker who quit over 35 years ago.  Pt used to drink heavily, but is currently a social drinker. On Family Hx the pt reports that her mother had breast cancer that was diagnosed in her late 38's. Her maternal grandmother also had breast cancer. Her father had testicular cancer in his youth.   MEDICAL HISTORY:  Past Medical History:  Diagnosis Date  . ASCUS (atypical squamous cells of undetermined significance) on Pap smear 03/2006   NEG FOR HISH RISK HPV  . COVID-19   . Depression   . Increased prolactin level 01/2007   WAS 37-- RECHECK WAS 28, 16 in 2011  . LGSIL (low grade squamous intraepithelial dysplasia) 07/2009   C&B, ECC NEGATIVE  . Ovarian cyst, left 2014    SURGICAL HISTORY: Past Surgical History:  Procedure Laterality Date  . CESAREAN SECTION  1986  . COLPOSCOPY  05/11    LGSIL--C&B-ECC NEG  . FOOT SURGERY  2007   BILATERAL  . NASAL SEPTUM SURGERY    . POLYPECTOMY    . ROTATOR CUFF REPAIR  2009   LEFT  . TONSILLECTOMY AND ADENOIDECTOMY      SOCIAL HISTORY: Social History   Socioeconomic History  . Marital status: Divorced    Spouse name: Not on file  . Number of children: Not on file  . Years of education: Not on file  . Highest education level: Not on file  Occupational History  . Not on file  Tobacco Use  . Smoking status: Former Games developer  . Smokeless tobacco: Never Used  . Tobacco comment: quit 28 years ago  Vaping Use  . Vaping Use: Never used  Substance and Sexual Activity  . Alcohol use: Yes    Alcohol/week: 1.0 standard drink    Types: 1 Glasses of wine per week    Comment: socially  . Drug use: No  . Sexual activity: Yes    Partners: Male    Birth control/protection: Post-menopausal  Other Topics Concern  . Not on file  Social History Narrative  . Not on file   Social Determinants of Health   Financial Resource Strain:   . Difficulty of Paying Living Expenses: Not on file  Food Insecurity:   . Worried About Programme researcher, broadcasting/film/video in the Last Year: Not on file  . Ran Out  of Food in the Last Year: Not on file  Transportation Needs:   . Lack of Transportation (Medical): Not on file  . Lack of Transportation (Non-Medical): Not on file  Physical Activity:   . Days of Exercise per Week: Not on file  . Minutes of Exercise per Session: Not on file  Stress:   . Feeling of Stress : Not on file  Social Connections:   . Frequency of Communication with Friends and Family: Not on file  . Frequency of Social Gatherings with Friends and Family: Not on file  . Attends Religious Services: Not on file  . Active Member of Clubs or Organizations: Not on file  . Attends Banker Meetings: Not on file  . Marital Status: Not on file  Intimate Partner Violence:   . Fear of Current or Ex-Partner: Not on file  . Emotionally Abused: Not on file  . Physically Abused: Not on file  . Sexually Abused: Not on  file    FAMILY HISTORY: Family History  Problem Relation Age of Onset  . Breast cancer Mother        metastized to liver  . Alcohol abuse Mother   . Depression Mother   . Breast cancer Father        testicular and then unknown later in life  . Bipolar disorder Sister     ALLERGIES:  is allergic to penicillins.  MEDICATIONS:  Current Outpatient Medications  Medication Sig Dispense Refill  . buPROPion (WELLBUTRIN SR) 100 MG 12 hr tablet Take 1 tablet (100 mg total) by mouth daily. 90 tablet 0  . escitalopram (LEXAPRO) 20 MG tablet Take 1.5 tablets (30 mg total) by mouth daily. 135 tablet 0  . ibuprofen (ADVIL,MOTRIN) 200 MG tablet Take 400 mg by mouth every 6 (six) hours as needed for moderate pain.    Marland Kitchen lamoTRIgine (LAMICTAL) 150 MG tablet Take 1 tablet (150 mg total) by mouth daily. 90 tablet 0  . latanoprost (XALATAN) 0.005 % ophthalmic solution     . mometasone-formoterol (DULERA) 100-5 MCG/ACT AERO Inhale 2 puffs into the lungs 2 (two) times daily.     No current facility-administered medications for this visit.    REVIEW OF SYSTEMS:    10  Point review of Systems was done is negative except as noted above.  PHYSICAL EXAMINATION: ECOG PERFORMANCE STATUS: 0 - Asymptomatic  . Vitals:   12/18/19 1121  BP: (!) 155/81  Pulse: 85  Resp: 18  Temp: (!) 97 F (36.1 C)  SpO2: 98%   Filed Weights   12/18/19 1121  Weight: 186 lb 3.2 oz (84.5 kg)   .Body mass index is 30.99 kg/m.  GENERAL:alert, in no acute distress and comfortable SKIN: no acute rashes, no significant lesions EYES: conjunctiva are pink and non-injected, sclera anicteric OROPHARYNX: MMM, no exudates, no oropharyngeal erythema or ulceration NECK: supple, no JVD LYMPH:  no palpable lymphadenopathy in the cervical, axillary or inguinal regions LUNGS: clear to auscultation b/l with normal respiratory effort HEART: regular rate & rhythm ABDOMEN:  normoactive bowel sounds , non tender, not distended. Extremity: no pedal edema PSYCH: alert & oriented x 3 with fluent speech NEURO: no focal motor/sensory deficits  LABORATORY DATA:  I have reviewed the data as listed  . CBC 09/20/2007  Hemoglobin 13.2 POINT OF CARE RESULT    .No flowsheet data found.   RADIOGRAPHIC STUDIES: I have personally reviewed the radiological images as listed and agreed with the findings in the report. CT ANGIO ABDOMEN W &/OR WO CONTRAST  Result Date: 11/23/2019 CLINICAL DATA:  Evaluate for renal artery stenosis. EXAM: CT ANGIOGRAPHY OF THE ABDOMEN TECHNIQUE: Multidetector CT imaging of the abdomen was performed using the standard protocol during bolus administration of intravenous contrast. Arterial and venous phase imaging was obtained. Multiplanar CT image reconstructions and MIPs were obtained to evaluate the vascular anatomy. CONTRAST:  38mL ISOVUE-370 IOPAMIDOL (ISOVUE-370) INJECTION 76% COMPARISON:  None. FINDINGS: VASCULAR Aorta: Normal caliber aorta without aneurysm, dissection, vasculitis or significant stenosis. Celiac: Patent without stenosis. However, there is concern for  a small intimal flap along the left side the celiac artery origin. Short segment nonocclusive dissection in this area cannot be excluded. Main branch vessels are patent. SMA: Patent without evidence of aneurysm, dissection, vasculitis or significant stenosis. Renals: Single renal arteries bilaterally. The left renal artery is widely patent without stenosis, dissection, aneurysm or evidence for FMD. Significant narrowing at the origin of the right renal artery measuring greater than 60%.  Origin of the right renal artery is adjacent to the right diaphragmatic crus and suggestive for compression in this area rather than atherosclerotic disease. There is no other atherosclerotic disease involving the right renal artery. Right renal artery is patent without aneurysm or dissection. IMA: Patent without evidence of aneurysm, dissection, vasculitis or significant stenosis. Inflow: Common iliac arteries are patent bilaterally. The proximal internal and external iliac arteries are patent bilaterally. Mild atherosclerotic disease in the iliac arteries. Venous: Portal venous system is patent. Main hepatic veins are patent. No gross abnormality to the IVC and renal veins. NON-VASCULAR Lower chest: 3 mm nodule in the left lower lobe on sequence 5 image 1. Tiny sub solid nodule measuring 4 mm in left lower lobe on sequence 5, image 7. No significant pleural fluid. Hepatobiliary: Normal appearance of the liver, gallbladder and portal venous system. No biliary dilatation. Pancreas: Unremarkable. No pancreatic ductal dilatation or surrounding inflammatory changes. Spleen: Exophytic structure along the lateral inferior aspect of the spleen measures up to 0.9 cm. Probable solid lesion along the superior aspect of the spleen best seen on sequence 6 image 40. This structure measures 5.6 x 4.9 x 3.9 cm. This superior splenic lesion is homogeneous on the venous phase imaging. Adrenals/Urinary Tract: Adrenal glands are within normal limits.  0.8 cm exophytic structure involving the posterior left kidney likely represents a cyst. No suspicious renal lesions. Negative for hydronephrosis. 3 mm nonobstructive stone in the right kidney upper pole region. Based on the sagittal images, the right kidney measures 9.9 cm in length and the left kidney measures 11.6 cm in length. Slightly decreased enhancement to the right kidney compared to the left based on the Hounsfield units. Stomach/Bowel: Small hiatal hernia. Normal appearance of the visualized small and large bowel in the abdomen. Lymphatic: No significant lymph node enlargement in the abdomen. Other: No ascites. Musculoskeletal: Diaphragmatic crus are prominent particularly on the right side. Rounded nodule in the posterior back soft tissues just below the skin measuring up to 1.6 cm and suspect this represents a sebaceous cyst. Disc space loss at L5-S1 with endplate sclerosis. Anterolisthesis at L4-L5 measuring roughly 5 mm that appears to be secondary to facet arthropathy. No evidence for a pars defect. Disc space loss at L2-L3. IMPRESSION: VASCULAR 1. Stenosis at the right renal artery origin. Degree of stenosis measures greater than 60%. Based on the location and configuration of the narrowing, suspect this stenosis is related to median arcuate ligament compression rather than atherosclerotic disease. Right renal artery stenosis could be hemodynamically significant based on the slight size discrepancy in the kidneys and asymmetric enhancement to the kidneys. Recommend vascular surgery consultation. 2. Concern for a small intimal abnormality at the origin of the celiac artery. A tiny dissection in this area cannot be excluded. Otherwise, the celiac artery is widely patent. 3. No significant atherosclerotic disease in the abdominal aorta. Mild atherosclerotic disease involving the iliac arteries. NON-VASCULAR 1. **An incidental finding of potential clinical significance has been found. Evidence for a  solid lesion or mass involving the superior aspect of the spleen measuring up to 5.6 cm. This lesion is relatively homogeneous on the delayed images and this lesion is indeterminate. In addition, there is a small exophytic lesion along the inferior aspect of spleen measuring roughly 0.9 cm. Recommend further characterization with abdominal MRI, with and without contrast.** 2. Small hiatal hernia. 3. Nonobstructive right kidney stone. 4. Degenerative changes in the lumbar spine as described. 5. Tiny pulmonary nodules at the left lung base.  These small nodules are indeterminate. No follow-up needed if patient is low-risk (and has no known or suspected primary neoplasm). Non-contrast chest CT can be considered in 12 months if patient is high-risk. This recommendation follows the consensus statement: Guidelines for Management of Incidental Pulmonary Nodules Detected on CT Images: From the Fleischner Society 2017; Radiology 2017; 284:228-243. These results will be called to the ordering clinician or representative by the Radiologist Assistant, and communication documented in the PACS or [employer]. Electronically Signed   By: Richarda Overlie M.D.   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 PLAN: -Discussed 11/21/2019 CT Angio Abd ([id number]) which revealed "An incidental finding of potential clinical significance has been found. Evidence for a solid lesion or mass involving the superior aspect of the spleen measuring up to 5.6 cm. This lesion is relatively homogeneous on the delayed images and this lesion is indeterminate. In addition, there is a small exophytic lesion along the inferior aspect of spleen measuring roughly 0.9 cm. Recommend further  characterization with abdominal MRI, with and without contrast."  -Discussed 03/04/2019 CT Angio Pulmonary, 02/28/2013 MRI Abdomen, and 02/01/2013 CT Abdomen reveal stable splenic mass.  -Advised pt that due to the stabilty of her splenic mass for the last seven years and lack symptoms do not suggest a malignant process.   -Advised pt that it is a possibility that her splenic mass is a hamartoma.  -Advised pt that we do not complete splenic biopsies because the spleen is a vascular organ.  -Advised pt that for a definitive result we could complete a splenectomy, but we would not recommend this.  -Advised pt that we could complete whole body imaging, but that is not indicated at this time.  -Advised pt that she could repeat abdominal imaging with PCP in 6-12 months.  -Recommend pt stay up to date with age appropriate cancer screening.  -Discussed completing genetic testing for risk assessment based on family history of cancer. Would refer to our genetic counselor.  -Advised pt that controlling th renal artery stenosis is the priority at this time.  -Recommend pt receive the COVID19 and annual flu vaccine.  -Will get labs today for baseline -Recommend pt f/u with Dr. Earnest Bailey.  -Will see pt back as needed.    FOLLOW UP: Labs today RTC with Dr Candise Che as needed   All of the patients questions were answered with apparent satisfaction. The patient knows to call the clinic with any problems, questions or concerns.  I spent 30 mins counseling the patient face to face. The total time spent in the appointment was 45 minutes and more than 50% was on counseling and direct patient cares.    Wyvonnia Lora MD MS AAHIVMS [facility] Hematology/Oncology Physician [facility]  (Office):       [phone] (Work cell):  [phone] (Fax):           [phone]  12/18/2019 7:27 AM  I, Carollee Herter, am acting as a scribe for Dr. Wyvonnia Lora.   .I have reviewed the above documentation for  accuracy and completeness, and I agree with the above. Johney Maine MD

## 2019-12-18 NOTE — Patient Instructions (Signed)
Thank you for choosing [facility] to provide your oncology and hematology care.   Should you have questions after your visit to the [facility] ([facility]), please contact this office at [phone] between 8:30 AM and 4:30 PM.  Voice mails left after 4:00 PM may not be returned until the following business day.  Calls received after 4:30 PM will be answered by an off-site Nurse Triage Line.    Prescription Refills:  Please have your pharmacy contact us directly for most prescription requests.  Contact the office directly for refills of narcotics (pain medications). Allow 48-72 hours for refills.  Appointments: Please contact the [facility] scheduling department [phone] for questions regarding [facility] appointment scheduling.  Contact the schedulers with any scheduling changes so that your appointment can be rescheduled in a timely manner.   Central Scheduling for [place] [phone] - Call to schedule procedures such as PET scans, CT scans, MRI, Ultrasound, etc.  To afford each patient quality time with our providers, please arrive 30 minutes before your scheduled appointment time.  If you arrive late for your appointment, you may be asked to reschedule.  We strive to give you quality time with our providers, and arriving late affects you and other patients whose appointments are after yours. If you are a no show for multiple scheduled visits, you may be dismissed from the clinic at the providers discretion.     Resources: [facility] Social Workers [phone] for additional information on assistance programs or assistance connecting with community support programs   [place] DSS  [phone]: Information regarding food stamps, Medicaid, and utility assistance [place] [phone]   [place] [employer]'s shared-ride transportation service for eligible riders who have a disability that prevents them from riding the fixed route bus.   Medicare  Rights Center 800-333-4114 Helps people with Medicare understand their rights and benefits, navigate the Medicare system, and secure the quality healthcare they deserve [employer] 800-227-2345 Assists patients locate various types of support and financial assistance Cancer Care: 1-800-813-HOPE (4673) Provides financial assistance, online support groups, medication/co-pay assistance.   Transportation Assistance for appointments at [facility]: Transportation Coordinator [phone]  Again, thank you for choosing [facility] for your care.       

## 2020-02-03 ENCOUNTER — Other Ambulatory Visit: Payer: Self-pay

## 2020-02-03 DIAGNOSIS — N950 Postmenopausal bleeding: Secondary | ICD-10-CM | POA: Insufficient documentation

## 2020-02-03 DIAGNOSIS — N83209 Unspecified ovarian cyst, unspecified side: Secondary | ICD-10-CM | POA: Insufficient documentation

## 2020-02-03 DIAGNOSIS — J45909 Unspecified asthma, uncomplicated: Secondary | ICD-10-CM | POA: Insufficient documentation

## 2020-02-03 DIAGNOSIS — I701 Atherosclerosis of renal artery: Secondary | ICD-10-CM

## 2020-02-04 ENCOUNTER — Encounter: Payer: Self-pay | Admitting: Vascular Surgery

## 2020-02-04 ENCOUNTER — Encounter: Payer: Federal, State, Local not specified - PPO | Admitting: Vascular Surgery

## 2020-02-04 ENCOUNTER — Other Ambulatory Visit: Payer: Self-pay

## 2020-02-04 ENCOUNTER — Ambulatory Visit (HOSPITAL_COMMUNITY)
Admission: RE | Admit: 2020-02-04 | Discharge: 2020-02-04 | Disposition: A | Discharge status: Home / Self Care | Payer: Federal, State, Local not specified - PPO | Source: Ambulatory Visit | Attending: Vascular Surgery | Admitting: Vascular Surgery

## 2020-02-04 ENCOUNTER — Ambulatory Visit: Payer: Federal, State, Local not specified - PPO | Admitting: Vascular Surgery

## 2020-02-04 VITALS — BP 129/76 | HR 75 | Temp 97.6°F | Resp 16 | Ht 65.5 in | Wt 184.0 lb

## 2020-02-04 DIAGNOSIS — I701 Atherosclerosis of renal artery: Secondary | ICD-10-CM

## 2020-02-04 NOTE — Progress Notes (Signed)
Patient name: Lindsey Klein MRN: [id number] DOB: 04-02-1955 Sex: female  REASON FOR CONSULT: Evaluate right renal artery stenosis  HPI: Lindsey Klein is a 64 y.o. female, with history of hypertension and hyperlipidemia that presents for evaluation of suspected right renal artery stenosis.  Patient states she had a Covid infection back in May 2020 and had Covid fog and other lingering symptoms.  Ultimately she had a CTA chest performed in 2020 with concern for right renal artery stenosis.  She was referred to [facility] and ultimately underwent a CTA to further evaluate.  There was concern for right renal artery stenosis and she was referred here.  She reports her kidney function has been stable.  Her blood pressure normally runs around 140 on one agent.  She is taking low dose amlodipine.  She has had no profoundly high blood pressures or volatile blood pressure.  No other cardiopulmonary syndromes.  Past Medical History:  Diagnosis Date  . ASCUS (atypical squamous cells of undetermined significance) on Pap smear 03/2006   NEG FOR HISH RISK HPV  . COVID-19   . Depression   . Increased prolactin level 01/2007   WAS 37-- RECHECK WAS 28, 16 in 2011  . LGSIL (low grade squamous intraepithelial dysplasia) 07/2009   C&B, ECC NEGATIVE  . Ovarian cyst, left 2014    Past Surgical History:  Procedure Laterality Date  . CESAREAN SECTION  1986  . COLPOSCOPY  05/11    LGSIL--C&B-ECC NEG  . FOOT SURGERY  2007   BILATERAL  . NASAL SEPTUM SURGERY    . POLYPECTOMY    . ROTATOR CUFF REPAIR  2009   LEFT  . TONSILLECTOMY AND ADENOIDECTOMY      Family History  Problem Relation Age of Onset  . Breast cancer Mother        metastized to liver  . Alcohol abuse Mother   . Depression Mother   . Breast cancer Father        testicular and then unknown later in life  . Bipolar disorder Sister     SOCIAL HISTORY: Social History   Socioeconomic History  . Marital status: Divorced     Spouse name: Not on file  . Number of children: Not on file  . Years of education: Not on file  . Highest education level: Not on file  Occupational History  . Not on file  Tobacco Use  . Smoking status: Former Games developer  . Smokeless tobacco: Never Used  . Tobacco comment: quit 28 years ago  Vaping Use  . Vaping Use: Never used  Substance and Sexual Activity  . Alcohol use: Yes    Alcohol/week: 1.0 standard drink    Types: 1 Glasses of wine per week    Comment: socially  . Drug use: No  . Sexual activity: Yes    Partners: Male    Birth control/protection: Post-menopausal  Other Topics Concern  . Not on file  Social History Narrative  . Not on file   Social Determinants of Health   Financial Resource Strain:   . Difficulty of Paying Living Expenses: Not on file  Food Insecurity:   . Worried About Programme researcher, broadcasting/film/video in the Last Year: Not on file  . Ran Out of Food in the Last Year: Not on file  Transportation Needs:   . Lack of Transportation (Medical): Not on file  . Lack of Transportation (Non-Medical): Not on file  Physical Activity:   . Days of  Exercise per Week: Not on file  . Minutes of Exercise per Session: Not on file  Stress:   . Feeling of Stress : Not on file  Social Connections:   . Frequency of Communication with Friends and Family: Not on file  . Frequency of Social Gatherings with Friends and Family: Not on file  . Attends Religious Services: Not on file  . Active Member of Clubs or Organizations: Not on file  . Attends Banker Meetings: Not on file  . Marital Status: Not on file  Intimate Partner Violence:   . Fear of Current or Ex-Partner: Not on file  . Emotionally Abused: Not on file  . Physically Abused: Not on file  . Sexually Abused: Not on file    Allergies  Allergen Reactions  . Penicillins Rash    Current Outpatient Medications  Medication Sig Dispense Refill  . amLODipine (NORVASC) 2.5 MG tablet Take 2.5 mg by mouth  daily.    Marland Kitchen atorvastatin (LIPITOR) 10 MG tablet Take 10 mg by mouth daily.    Marland Kitchen buPROPion (WELLBUTRIN SR) 100 MG 12 hr tablet Take 1 tablet (100 mg total) by mouth daily. 90 tablet 0  . latanoprost (XALATAN) 0.005 % ophthalmic solution     . mometasone-formoterol (DULERA) 100-5 MCG/ACT AERO Inhale 2 puffs into the lungs 2 (two) times daily.    Marland Kitchen venlafaxine XR (EFFEXOR-XR) 150 MG 24 hr capsule Take by mouth.    . escitalopram (LEXAPRO) 20 MG tablet Take 1.5 tablets (30 mg total) by mouth daily. (Patient not taking: Reported on 12/18/2019) 135 tablet 0  . ibuprofen (ADVIL,MOTRIN) 200 MG tablet Take 400 mg by mouth every 6 (six) hours as needed for moderate pain. (Patient not taking: Reported on 12/18/2019)    . lamoTRIgine (LAMICTAL) 150 MG tablet Take 1 tablet (150 mg total) by mouth daily. (Patient not taking: Reported on 02/04/2020) 90 tablet 0   No current facility-administered medications for this visit.    REVIEW OF SYSTEMS:  [X]  denotes positive finding, [ ]  denotes negative finding Cardiac  Comments:  Chest pain or chest pressure:    Shortness of breath upon exertion:    Short of breath when lying flat:    Irregular heart rhythm:        Vascular    Pain in calf, thigh, or hip brought on by ambulation:    Pain in feet at night that wakes you up from your sleep:     Blood clot in your veins:    Leg swelling:         Pulmonary    Oxygen at home:    Productive cough:     Wheezing:         Neurologic    Sudden weakness in arms or legs:     Sudden numbness in arms or legs:     Sudden onset of difficulty speaking or slurred speech:    Temporary loss of vision in one eye:     Problems with dizziness:         Gastrointestinal    Blood in stool:     Vomited blood:         Genitourinary    Burning when urinating:     Blood in urine:        Psychiatric    Major depression:         Hematologic    Bleeding problems:    Problems with blood clotting too easily:  Skin     Rashes or ulcers:        Constitutional    Fever or chills:      PHYSICAL EXAM: Vitals:   02/04/20 1016  BP: 129/76  Pulse: 75  Resp: 16  Temp: 97.6 F (36.4 C)  TempSrc: Temporal  SpO2: 97%  Weight: 184 lb (83.5 kg)  Height: 5' 5.5" (1.664 m)    GENERAL: The patient is a well-nourished female, in no acute distress. The vital signs are documented above. CARDIAC: There is a regular rate and rhythm.  VASCULAR:  Palpable femoral pulses both groins PULMONARY: There is good air exchange bilaterally without wheezing or rales. ABDOMEN: Soft and non-tender. MUSCULOSKELETAL: There are no major deformities or cyanosis. NEUROLOGIC: No focal weakness or paresthesias are detected. SKIN: There are no ulcers or rashes noted. PSYCHIATRIC: The patient has a normal affect.  DATA:   CTA 11/23/2019 on my review the right renal artery is much smaller than the left renal artery and comes off the aorta at the level of the SMA.  There is concern for a proximal right renal artery stenosis with extrinsic compression.  Renal artery duplex today shows no evidence of right renal artery stenosis.  Assessment/Plan:  64 year old female referred here for evaluation of possible right renal artery stenosis.  I reviewed her CT scan with her and the concern for extrinsic compression given no evidence of significant atherosclerotic disease.  That being said, we obtained a renal artery duplex in the office today and there is no evidence of proximal right renal artery stenosis based on the duplex and it looks patent.  I discussed with her that given the duplex shows no evidence of stenosis, I would not recommend any renal artery intervention.  In addition she really has no clinical symptoms that would warrant intervention including no uncontrolled hypertension, worsening renal function, or other cardiopulmonary syndromes.  We discussed that the typical recommendation for intervention is greater than 60% stenosis  with some clinical symptoms that would appropriately warrant intervention including worsening renal function, worsening BP after optimal medical management, or cardiopulmonary syndrome.  Her highest blood pressure is normally in the 140s at home and she is only on one agent when she was started on low dose amlodipine in June.  No evidence she has refractory HTN at this time and again the renal artery duplex was reassuring.  I've offered PRN follow-up.   Cephus Shelling, MD [facility] Office: [phone]

## 2021-05-03 ENCOUNTER — Emergency Department: Payer: Medicare Other

## 2021-05-03 ENCOUNTER — Emergency Department
Admission: EM | Admit: 2021-05-03 | Discharge: 2021-05-03 | Disposition: A | Discharge status: Home / Self Care | Payer: Medicare Other | Attending: Emergency Medicine | Admitting: Emergency Medicine

## 2021-05-03 ENCOUNTER — Other Ambulatory Visit: Payer: Self-pay

## 2021-05-03 DIAGNOSIS — Y92002 Bathroom of unspecified non-institutional (private) residence single-family (private) house as the place of occurrence of the external cause: Secondary | ICD-10-CM | POA: Diagnosis not present

## 2021-05-03 DIAGNOSIS — R519 Headache, unspecified: Secondary | ICD-10-CM | POA: Diagnosis present

## 2021-05-03 DIAGNOSIS — W19XXXA Unspecified fall, initial encounter: Secondary | ICD-10-CM

## 2021-05-03 DIAGNOSIS — S0003XA Contusion of scalp, initial encounter: Secondary | ICD-10-CM | POA: Insufficient documentation

## 2021-05-03 DIAGNOSIS — W01118A Fall on same level from slipping, tripping and stumbling with subsequent striking against other sharp object, initial encounter: Secondary | ICD-10-CM | POA: Insufficient documentation

## 2021-05-03 MED ORDER — EXCEDRIN MIGRAINE 250-250-65 MG PO TABS: 1.0000 | ORAL_TABLET | Freq: Four times a day (QID) | ORAL | 0 refills | Status: AC | PRN

## 2021-05-03 NOTE — ED Provider Notes (Signed)
[facility] Provider Note  Patient Contact: 3:11 PM (approximate)   History   Fall   HPI  Lindsey Klein is a 66 y.o. female who presents the emergency department after falling and hitting her head last night.  Patient states that she was at her daughter's house, slipped and fell backwards hitting her head on hardwood floor.  Patient states that she had brief loss of consciousness but is unsure whether this was more from hitting her head or from alcohol consumption.  Patient states that she did not have symptoms last night but did wake with a headache today.  She describes this is a posterior headache as well as some pain behind both of her eyes.  No vision changes or loss, patient denies any radicular symptoms.  She denied any neck pain, chest pain, other musculoskeletal complaints.  Patient denies any chest pain or shortness of breath as well.  Patient is here for evaluation of head injury.  She does not take any medications prior to arrival.  She states that it is tender to push on the posterior occipital skull     Physical Exam   Triage Vital Signs: ED Triage Vitals  Enc Vitals Group     BP 05/03/21 1353 (!) 146/79     Pulse Rate 05/03/21 1353 84     Resp 05/03/21 1353 18     Temp 05/03/21 1353 98.7 F (37.1 C)     Temp Source 05/03/21 1353 Oral     SpO2 05/03/21 1353 99 %     Weight --      Height 05/03/21 1354 5' 5.5" (1.664 m)     Head Circumference --      Peak Flow --      Pain Score 05/03/21 1353 3     Pain Loc --      Pain Edu? --      Excl. in GC? --     Most recent vital signs: Vitals:   05/03/21 1353  BP: (!) 146/79  Pulse: 84  Resp: 18  Temp: 98.7 F (37.1 C)  SpO2: 99%     General: Alert and in no acute distress. Eyes:  PERRL. EOMI. Head: No acute traumatic findings with abrasions, lacerations, hematoma or ecchymosis.  Patient is tender to palpation of the left occipital skull.  No palpable abnormality or crepitus.  No  battle signs, raccoon eyes, serosanguineous fluid drainage from the ears or nares.  Neck: No stridor. No cervical spine tenderness to palpation.  Cardiovascular:  Good peripheral perfusion Respiratory: Normal respiratory effort without tachypnea or retractions. Lungs CTAB. Good air entry to the bases with no decreased or absent breath sounds. Musculoskeletal: Full range of motion to all extremities.  Neurologic:  No gross focal neurologic deficits are appreciated.  Cranial nerves II through XII grossly intact. Skin:   No rash noted Other:   ED Results / Procedures / Treatments   Labs (all labs ordered are listed, but only abnormal results are displayed) Labs Reviewed - No data to display   EKG     RADIOLOGY  I personally viewed and evaluated these images as part of my medical decision making, as well as reviewing the written report by the radiologist.  ED Provider Interpretation: No acute traumatic findings on CT scan of the head or neck specifically no skull fracture, intracranial hemorrhage or cervical spine fracture  CT HEAD WO CONTRAST ( )  Result Date: 05/03/2021 CLINICAL DATA:  Trauma EXAM: CT HEAD WITHOUT  CONTRAST TECHNIQUE: Contiguous axial images were obtained from the base of the skull through the vertex without intravenous contrast. RADIATION DOSE REDUCTION: This exam was performed according to the departmental dose-optimization program which includes automated exposure control, adjustment of the mA and/or kV according to patient size and/or use of iterative reconstruction technique. COMPARISON:  None. FINDINGS: Brain: No acute intracranial findings are seen. There is no shift of midline structures. There are no epidural or subdural fluid collections. Vascular: Unremarkable Skull: No fracture is seen. Sinuses/Orbits: There is mucosal thickening in the ethmoid sinus. There is fluid density in the few right mastoid air cells. Other: None. IMPRESSION: No acute intracranial  findings are seen in noncontrast CT brain. Mild chronic sinusitis.  Small right mastoid effusion. Electronically Signed   By: Ernie Avena M.D.   On: 05/03/2021 15:52   CT Cervical Spine Wo Contrast  Result Date: 05/03/2021 CLINICAL DATA:  Trauma EXAM: CT CERVICAL SPINE WITHOUT CONTRAST TECHNIQUE: Multidetector CT imaging of the cervical spine was performed without intravenous contrast. Multiplanar CT image reconstructions were also generated. RADIATION DOSE REDUCTION: This exam was performed according to the departmental dose-optimization program which includes automated exposure control, adjustment of the mA and/or kV according to patient size and/or use of iterative reconstruction technique. COMPARISON:  None. FINDINGS: Alignment: There is minimal 2 mm anterolisthesis at C3-C4 level which may be due to previous ligament injury and facet degeneration, more so on the left side. Skull base and vertebrae: No recent fracture is seen. Degenerative changes are noted with disc space narrowing and bony spurs from C4-C7 levels. Soft tissues and spinal canal: There is no significant central spinal stenosis. Disc levels: There is mild to moderate encroachment of right neural foramina at C2-C3 level. There is marked encroachment of left neural foramen at C3-C4 level. There is mild to moderate encroachment of neural foramina from C4-C7 levels. Upper chest: Unremarkable. Other: None. IMPRESSION: No recent fracture is seen in the cervical spine. Cervical spondylosis. Minimal anterolisthesis at C3-C4 level most likely is residual from previous ligament injury and facet degeneration. There is encroachment of neural foramina from C2-C7 levels, particularly severe on the left side at C3-C4 level. Electronically Signed   By: Ernie Avena M.D.   On: 05/03/2021 15:50    PROCEDURES:  Critical Care performed: No  Procedures   MEDICATIONS ORDERED IN ED: Medications - No data to display   IMPRESSION / MDM /  ASSESSMENT AND PLAN / ED COURSE  I reviewed the triage vital signs and the nursing notes.                              Differential diagnosis includes, but is not limited to, scalp contusion, skull fracture, cervical spine fracture, intracranial hemorrhage   Patient's diagnosis is consistent with fall, scalp contusion.  Patient presents to the emergency department after falling and hitting the occipital skull on the floor last night.  Patient was intoxicated at the time.  She states that she did not have any symptoms yesterday but has pain to her skull and a slight headache today.  Neurologically intact on exam.  Given the mechanism of injury patient had imaging of the head and neck which are reassuring at this time.  Patient remains neurologically intact and will be discharged with prescriptions for Excedrin for any headache.  Follow-up primary care as needed.  Return precautions discussed with the patient.. Patient is given ED precautions to return to the  ED for any worsening or new symptoms.       FINAL CLINICAL IMPRESSION(S) / ED DIAGNOSES   Final diagnoses:  Fall, initial encounter  Contusion of occipital region of scalp, initial encounter     Rx / DC Orders   ED Discharge Orders          Ordered    aspirin-acetaminophen-caffeine (EXCEDRIN MIGRAINE) 250-250-65 MG tablet  Every 6 hours PRN        05/03/21 1642             Note:  This document was prepared using Dragon voice recognition software and may include unintentional dictation errors.   Racheal Patches, PA-C 05/03/21 1643    Georga Hacking, MD 05/04/21 [phone]

## 2021-05-03 NOTE — ED Triage Notes (Signed)
Patient to ER via POV with complaints of fall. Reports ground level fall last night, states she had been drinking alcohol and remembers falling backwards when trying to use the bathroom and hitting her head on the wood flooring. Denies LOC but due to alcohol intoxication does not remember if she passed out/ events surrounding incident. Reports going to bed as normal, states this morning the back of her head is sore. Worse with palpation.   Denies blood thinner usage.

## 2021-05-04 ENCOUNTER — Ambulatory Visit: Payer: Self-pay

## 2022-09-05 ENCOUNTER — Ambulatory Visit (INDEPENDENT_AMBULATORY_CARE_PROVIDER_SITE_OTHER): Payer: Medicare Other | Admitting: Family Medicine

## 2022-09-05 VITALS — BP 138/88 | Ht 65.0 in | Wt 186.0 lb

## 2022-09-05 DIAGNOSIS — M818 Other osteoporosis without current pathological fracture: Secondary | ICD-10-CM

## 2022-09-05 DIAGNOSIS — M810 Age-related osteoporosis without current pathological fracture: Secondary | ICD-10-CM | POA: Diagnosis present

## 2022-09-05 DIAGNOSIS — M1929 Secondary osteoarthritis, other specified site: Secondary | ICD-10-CM

## 2022-09-05 NOTE — Patient Instructions (Signed)
We will check on approval for Evenity for you to build more bone with your femur bone density -3.0.  This would be an injection in our office monthly for 12 months then you would switch to a medicine that maintains the bone. Get labwork after you leave today (CBC with diff, CMP, PTH, SPEP, TSH, 25-OH vitamin D). Continue calcium and vitamin D supplementation. Weight-bearing exercise is beneficial.

## 2022-09-06 ENCOUNTER — Telehealth: Payer: Self-pay

## 2022-09-06 NOTE — Telephone Encounter (Signed)
-----   Message -----  From: Annita Brod, CMA  Sent: 09/05/2022   3:20 PM EDT  To: Lenda Kelp, MD; Dierdre Searles, CMA   Run VOB for evenity on this pt please

## 2022-09-06 NOTE — Progress Notes (Signed)
PCP: Macy Mis, MD  Subjective:   HPI: Patient is a 67 y.o. female here for osteoporosis.  Prior treatment: fosamax - had trouble with packaging and remembering to take this History of Hip, Spine, or Wrist Fracture: wrist fracture 7 years ago Heart disease or stroke: no Cancer: no Kidney Disease: no Gastric/Peptic Ulcer: no Gastric bypass surgery: no Severe GERD: no History of seizures: no Age at Menopause: 55 Calcium intake: supplements Vitamin D intake: supplements Hormone replacement therapy: no Smoking history: 12 years, 2 ppd - quit 38 yrs ago Alcohol: rare Exercise: none Major dental work in past year: yes - implant Parents with hip/spine fracture: no  Past Medical History:  Diagnosis Date   ASCUS (atypical squamous cells of undetermined significance) on Pap smear 03/2006   NEG FOR HISH RISK HPV   COVID-19    Depression    Increased prolactin level 01/2007   WAS 37-- RECHECK WAS 28, 16 in 2011   LGSIL (low grade squamous intraepithelial dysplasia) 07/2009   C&B, ECC NEGATIVE   Ovarian cyst, left 2014    Current Outpatient Medications on File Prior to Visit  Medication Sig Dispense Refill   amLODipine (NORVASC) 2.5 MG tablet Take 2.5 mg by mouth daily.     aspirin-acetaminophen-caffeine (EXCEDRIN MIGRAINE) 250-250-65 MG tablet Take 1 tablet by mouth every 6 (six) hours as needed for headache. 30 tablet 0   atorvastatin (LIPITOR) 10 MG tablet Take 10 mg by mouth daily.     buPROPion (WELLBUTRIN SR) 100 MG 12 hr tablet Take 1 tablet (100 mg total) by mouth daily. 90 tablet 0   escitalopram (LEXAPRO) 20 MG tablet Take 1.5 tablets (30 mg total) by mouth daily. (Patient not taking: Reported on 12/18/2019) 135 tablet 0   ibuprofen (ADVIL,MOTRIN) 200 MG tablet Take 400 mg by mouth every 6 (six) hours as needed for moderate pain. (Patient not taking: Reported on 12/18/2019)     lamoTRIgine (LAMICTAL) 150 MG tablet Take 1 tablet (150 mg total) by mouth daily. (Patient  not taking: Reported on 02/04/2020) 90 tablet 0   latanoprost (XALATAN) 0.005 % ophthalmic solution      mometasone-formoterol (DULERA) 100-5 MCG/ACT AERO Inhale 2 puffs into the lungs 2 (two) times daily.     venlafaxine XR (EFFEXOR-XR) 150 MG 24 hr capsule Take by mouth.     No current facility-administered medications on file prior to visit.    Past Surgical History:  Procedure Laterality Date   CESAREAN SECTION  1986   COLPOSCOPY  05/11    LGSIL--C&B-ECC NEG   FOOT SURGERY  2007   BILATERAL   NASAL SEPTUM SURGERY     POLYPECTOMY     ROTATOR CUFF REPAIR  2009   LEFT   TONSILLECTOMY AND ADENOIDECTOMY      Allergies  Allergen Reactions   Penicillins Rash    BP 138/88   Ht 5\' 5"  (1.651 m)   Wt 186 lb (84.4 kg)   LMP 05/27/2010   BMI 30.95 kg/m       No data to display              No data to display              Objective:  Physical Exam:  Gen: NAD, comfortable in exam room  Dexa 11/05/20 T scores: Spine -1.3, prox femur -3.0, forearm -1.4 No labwork available.   Assessment & Plan:  1. Osteoporosis - patient with severe osteoporosis - prox femur T score -3.0  and history of osteoporotic fracture of her wrist 7 years ago.  Will check labwork (CBC with diff, CMP, PTH, SPEP, TSH, 25-OH vitamin D).  Look into approval for Evenity given her high risk.  She did fosamax in the past but trouble remembering to take and difficulty with packaging.  Calcium and vitamin D supplementation, encourage exercise.    Total visit time 30 minutes including documentation.

## 2022-09-08 LAB — CBC WITH DIFFERENTIAL/PLATELET
Basophils Absolute: 0.1 10*3/uL (ref 0.0–0.2)
Basos: 1 %
EOS (ABSOLUTE): 0.1 10*3/uL (ref 0.0–0.4)
Eos: 2 %
Hematocrit: 43.2 % (ref 34.0–46.6)
Hemoglobin: 14.2 g/dL (ref 11.1–15.9)
Immature Grans (Abs): 0 10*3/uL (ref 0.0–0.1)
Immature Granulocytes: 0 %
Lymphocytes Absolute: 1.4 10*3/uL (ref 0.7–3.1)
Lymphs: 24 %
MCH: 28.1 pg (ref 26.6–33.0)
MCHC: 32.9 g/dL (ref 31.5–35.7)
MCV: 85 fL (ref 79–97)
Monocytes Absolute: 0.5 10*3/uL (ref 0.1–0.9)
Monocytes: 9 %
Neutrophils Absolute: 3.9 10*3/uL (ref 1.4–7.0)
Neutrophils: 64 %
Platelets: 259 10*3/uL (ref 150–450)
RBC: 5.06 x10E6/uL (ref 3.77–5.28)
RDW: 12.7 % (ref 11.7–15.4)
WBC: 6 10*3/uL (ref 3.4–10.8)

## 2022-09-08 LAB — COMPREHENSIVE METABOLIC PANEL
ALT: 17 IU/L (ref 0–32)
AST: 14 IU/L (ref 0–40)
Albumin/Globulin Ratio: 1.6
Albumin: 4.6 g/dL (ref 3.9–4.9)
Alkaline Phosphatase: 106 IU/L (ref 44–121)
BUN/Creatinine Ratio: 14 (ref 12–28)
BUN: 12 mg/dL (ref 8–27)
Bilirubin Total: 0.4 mg/dL (ref 0.0–1.2)
CO2: 25 mmol/L (ref 20–29)
Calcium: 10.7 mg/dL — ABNORMAL HIGH (ref 8.7–10.3)
Chloride: 101 mmol/L (ref 96–106)
Creatinine, Ser: 0.86 mg/dL (ref 0.57–1.00)
Globulin, Total: 2.8 g/dL (ref 1.5–4.5)
Glucose: 91 mg/dL (ref 70–99)
Potassium: 5.2 mmol/L (ref 3.5–5.2)
Sodium: 139 mmol/L (ref 134–144)
Total Protein: 7.4 g/dL (ref 6.0–8.5)
eGFR: 74 mL/min/{1.73_m2} (ref >59)

## 2022-09-08 LAB — PROTEIN ELECTROPHORESIS, SERUM
A/G Ratio: 1.2 (ref 0.7–1.7)
Albumin ELP: 4.1 g/dL (ref 2.9–4.4)
Alpha 1: 0.3 g/dL (ref 0.0–0.4)
Alpha 2: 0.8 g/dL (ref 0.4–1.0)
Beta: 1.2 g/dL (ref 0.7–1.3)
Gamma Globulin: 1.2 g/dL (ref 0.4–1.8)
Globulin, Total: 3.3 g/dL (ref 2.2–3.9)

## 2022-09-08 LAB — PARATHYROID HORMONE, INTACT (NO CA): PTH: 42 pg/mL (ref 15–65)

## 2022-09-08 LAB — TSH: TSH: 2.97 u[IU]/mL (ref 0.450–4.500)

## 2022-09-08 LAB — VITAMIN D 25 HYDROXY (VIT D DEFICIENCY, FRACTURES): Vit D, 25-Hydroxy: 32.9 ng/mL (ref 30.0–100.0)

## 2022-09-09 ENCOUNTER — Other Ambulatory Visit: Payer: Self-pay | Admitting: *Deleted

## 2022-09-09 ENCOUNTER — Encounter: Payer: Self-pay | Admitting: Family Medicine

## 2022-09-09 DIAGNOSIS — M810 Age-related osteoporosis without current pathological fracture: Secondary | ICD-10-CM

## 2022-09-12 NOTE — Telephone Encounter (Signed)
Evenity VOB initiated via [email or web address]  New start  

## 2022-09-16 NOTE — Telephone Encounter (Signed)
Pt ready for scheduling on or after 09/16/22  Out-of-pocket cost due at time of visit: $0  Primary: Medicare Evenity co-insurance: 20% (approximately $418) Admin fee co-insurance: 20% (approximately $25)  Secondary: BCBS FEP Evenity co-insurance: Covers Medicare Part B co-insurance Admin fee co-insurance: Covers Medicare Part B co-insurance  Deductible:  Covered by secondary  Prior Auth: NOT required PA# Valid:   ** This summary of benefits is an estimation of the patient's out-of-pocket cost. Exact cost may vary based on individual plan coverage.

## 2022-09-20 ENCOUNTER — Ambulatory Visit (INDEPENDENT_AMBULATORY_CARE_PROVIDER_SITE_OTHER): Payer: Medicare Other | Admitting: Sports Medicine

## 2022-09-20 DIAGNOSIS — M810 Age-related osteoporosis without current pathological fracture: Secondary | ICD-10-CM | POA: Diagnosis not present

## 2022-09-20 MED ORDER — ROMOSOZUMAB-AQQG 105 MG/1.17ML ~~LOC~~ SOSY
210.0000 mg | PREFILLED_SYRINGE | Freq: Once | SUBCUTANEOUS | Status: AC
  Administered 2022-09-20: 210 mg via SUBCUTANEOUS

## 2022-09-20 NOTE — Progress Notes (Signed)
Patient is here for evenity injection #1. Patient received bilateral arm [place] evenity injections today. She tolerated injections well. She waited 15 mins to ensure no reaction to injection. She will return in 1 month for her next evenity injection

## 2022-10-03 NOTE — Telephone Encounter (Addendum)
Evenity Injection Schedule Inj #1 - 09/20/22 Inj #2 - 10/21/22 Inj #3 - 12/26/22 Inj #4 - 01/31/23 Inj #5 - 03/14/23  Re-run benefits for 2025  Inj #6 - scheduled 04/18/23 Inj #7 - scheduled 05/23/23 Inj #8  Inj #9  Inj #10  Inj #11  Inj #12    No prior auth required Medicare Primary, BCBS FEP secondary - 2024

## 2022-10-11 ENCOUNTER — Other Ambulatory Visit: Payer: Self-pay | Admitting: Family Medicine

## 2022-10-12 LAB — CALCIUM: Calcium: 9.8 mg/dL (ref 8.7–10.3)

## 2022-10-21 ENCOUNTER — Ambulatory Visit (INDEPENDENT_AMBULATORY_CARE_PROVIDER_SITE_OTHER): Payer: Medicare Other | Admitting: Family Medicine

## 2022-10-21 DIAGNOSIS — M810 Age-related osteoporosis without current pathological fracture: Secondary | ICD-10-CM | POA: Diagnosis present

## 2022-10-21 MED ORDER — ROMOSOZUMAB-AQQG 105 MG/1.17ML ~~LOC~~ SOSY
210.0000 mg | PREFILLED_SYRINGE | Freq: Once | SUBCUTANEOUS | Status: AC
  Administered 2022-10-21: 210 mg via SUBCUTANEOUS

## 2022-10-21 NOTE — Progress Notes (Unsigned)
Patient is here for evenity injection #2. Patient received bilateral arm [place] evenity injections today. She tolerated injections well. She will return in 1 month for her next evenity injection.

## 2022-11-21 ENCOUNTER — Telehealth: Payer: Self-pay | Admitting: *Deleted

## 2022-11-21 NOTE — Telephone Encounter (Signed)
Pt resched evenity for 9/30

## 2022-11-22 ENCOUNTER — Ambulatory Visit: Payer: Federal, State, Local not specified - PPO | Admitting: Family Medicine

## 2022-11-23 ENCOUNTER — Ambulatory Visit: Payer: Federal, State, Local not specified - PPO | Admitting: Family Medicine

## 2022-12-26 ENCOUNTER — Ambulatory Visit: Payer: Medicare Other | Admitting: Family Medicine

## 2022-12-26 DIAGNOSIS — M810 Age-related osteoporosis without current pathological fracture: Secondary | ICD-10-CM

## 2022-12-26 MED ORDER — ROMOSOZUMAB-AQQG 105 MG/1.17ML ~~LOC~~ SOSY
210.0000 mg | PREFILLED_SYRINGE | Freq: Once | SUBCUTANEOUS | Status: AC
  Administered 2022-12-26: 210 mg via SUBCUTANEOUS

## 2022-12-26 NOTE — Progress Notes (Signed)
Patient is here for evenity injection #3. Patient received bilateral arm [place] evenity injections today. She tolerated injections well. She will return in 1 month for her next evenity injection.   

## 2023-01-27 ENCOUNTER — Ambulatory Visit: Payer: Federal, State, Local not specified - PPO | Admitting: Family Medicine

## 2023-01-31 ENCOUNTER — Ambulatory Visit: Payer: Medicare Other | Admitting: Family Medicine

## 2023-01-31 DIAGNOSIS — M810 Age-related osteoporosis without current pathological fracture: Secondary | ICD-10-CM | POA: Diagnosis present

## 2023-01-31 MED ORDER — ROMOSOZUMAB-AQQG 105 MG/1.17ML ~~LOC~~ SOSY
210.0000 mg | PREFILLED_SYRINGE | Freq: Once | SUBCUTANEOUS | Status: AC
  Administered 2023-01-31: 210 mg via SUBCUTANEOUS

## 2023-01-31 NOTE — Progress Notes (Signed)
 Patient is here for evenity injection #4. Patient received bilateral arm [place] evenity injections today. She tolerated injections well. She will return in 1 month for her next evenity injection

## 2023-02-28 ENCOUNTER — Ambulatory Visit: Payer: Federal, State, Local not specified - PPO | Admitting: Family Medicine

## 2023-03-03 ENCOUNTER — Ambulatory Visit: Payer: Federal, State, Local not specified - PPO | Admitting: Family Medicine

## 2023-03-14 ENCOUNTER — Ambulatory Visit (INDEPENDENT_AMBULATORY_CARE_PROVIDER_SITE_OTHER): Payer: Medicare Other | Admitting: Family Medicine

## 2023-03-14 DIAGNOSIS — M810 Age-related osteoporosis without current pathological fracture: Secondary | ICD-10-CM

## 2023-03-14 MED ORDER — ROMOSOZUMAB-AQQG 105 MG/1.17ML ~~LOC~~ SOSY
210.0000 mg | PREFILLED_SYRINGE | Freq: Once | SUBCUTANEOUS | Status: AC
  Administered 2023-03-14: 210 mg via SUBCUTANEOUS

## 2023-03-14 NOTE — Progress Notes (Signed)
Patient is here for evenity injection #5. Patient received bilateral arm [place] evenity injections today. She tolerated injections well. She will return in 1 month for her next evenity injection.

## 2023-03-18 NOTE — Telephone Encounter (Signed)
VOB request submitted for 2025 benefits.

## 2023-04-04 ENCOUNTER — Ambulatory Visit: Payer: Federal, State, Local not specified - PPO | Admitting: Family Medicine

## 2023-04-11 NOTE — Telephone Encounter (Signed)
 Patient is ready for scheduling on or after: 04/18/23 BUY AND BILL  Out-of-pocket cost due at time of visit: $0  Primary: [place] Medicare  Evenity  co-insurance: 20% (approximately $469.14) Admin fee co-insurance: 20% (approximately $25)  Deductible: $257 ($0 met)  Prior Auth: Not req'd   Secondary: [employer] [place] FEP The secondary plan does not follow Medicare guidelines.  The secondary plan will consider the Medicare Part B deductible and co-insurance.  ** This summary of benefits is an estimation of the patient's out-of-pocket cost. Exact cost may vary based on individual plan coverage.

## 2023-04-13 NOTE — Telephone Encounter (Addendum)
 Evenity Injection Schedule Inj #1 - 09/20/22 Inj #2 - 10/21/22 Inj #3 - 12/26/22 Inj #4 - 01/31/23 Inj #5 - 03/14/23   Re-run benefits for 2025   Inj #6 - 04/18/23 Inj #7 - 05/23/23 Inj #8 - 06/21/23 Inj #9 - scheduled 07/24/23 Inj #10 - scheduled 08/24/23 Inj #11  Inj #12      No prior auth required Medicare Primary, BCBS FEP secondary - 2025

## 2023-04-18 ENCOUNTER — Ambulatory Visit (INDEPENDENT_AMBULATORY_CARE_PROVIDER_SITE_OTHER): Payer: Medicare Other | Admitting: Family Medicine

## 2023-04-18 VITALS — Ht 65.5 in

## 2023-04-18 DIAGNOSIS — M810 Age-related osteoporosis without current pathological fracture: Secondary | ICD-10-CM

## 2023-04-18 MED ORDER — ROMOSOZUMAB-AQQG 105 MG/1.17ML ~~LOC~~ SOSY
210.0000 mg | PREFILLED_SYRINGE | Freq: Once | SUBCUTANEOUS | Status: AC
  Administered 2023-04-18: 210 mg via SUBCUTANEOUS

## 2023-04-18 NOTE — Progress Notes (Signed)
Patient is here for evenity injection #6. Patient received bilateral arm [place] evenity injections today. She tolerated injections well. She will return in 1 month for her next evenity injection.

## 2023-05-09 ENCOUNTER — Ambulatory Visit: Payer: Federal, State, Local not specified - PPO | Admitting: Family Medicine

## 2023-05-23 ENCOUNTER — Ambulatory Visit (INDEPENDENT_AMBULATORY_CARE_PROVIDER_SITE_OTHER): Payer: Medicare Other | Admitting: Family Medicine

## 2023-05-23 DIAGNOSIS — M810 Age-related osteoporosis without current pathological fracture: Secondary | ICD-10-CM

## 2023-05-23 MED ORDER — ROMOSOZUMAB-AQQG 105 MG/1.17ML ~~LOC~~ SOSY
210.0000 mg | PREFILLED_SYRINGE | Freq: Once | SUBCUTANEOUS | Status: AC
  Administered 2023-05-23: 210 mg via SUBCUTANEOUS

## 2023-05-23 NOTE — Progress Notes (Unsigned)
 Patient is here for evenity injection #7. Patient received bilateral arm [place] evenity injections today. She tolerated injections well. She will return in 1 month for her next evenity injection.

## 2023-06-21 ENCOUNTER — Ambulatory Visit (INDEPENDENT_AMBULATORY_CARE_PROVIDER_SITE_OTHER): Payer: Federal, State, Local not specified - PPO | Admitting: Family Medicine

## 2023-06-21 DIAGNOSIS — M810 Age-related osteoporosis without current pathological fracture: Secondary | ICD-10-CM | POA: Diagnosis present

## 2023-06-21 MED ORDER — ROMOSOZUMAB-AQQG 105 MG/1.17ML ~~LOC~~ SOSY
210.0000 mg | PREFILLED_SYRINGE | Freq: Once | SUBCUTANEOUS | Status: AC
  Administered 2023-06-21: 210 mg via SUBCUTANEOUS

## 2023-06-21 NOTE — Progress Notes (Signed)
Patient is here for evenity injection #8. Patient received bilateral arm [place] evenity injections today. She tolerated injections well. She will return in 1 month for her next evenity injection.

## 2023-07-24 ENCOUNTER — Ambulatory Visit (INDEPENDENT_AMBULATORY_CARE_PROVIDER_SITE_OTHER): Admitting: Family Medicine

## 2023-07-24 DIAGNOSIS — M810 Age-related osteoporosis without current pathological fracture: Secondary | ICD-10-CM | POA: Diagnosis present

## 2023-07-24 MED ORDER — ROMOSOZUMAB-AQQG 105 MG/1.17ML ~~LOC~~ SOSY
210.0000 mg | PREFILLED_SYRINGE | Freq: Once | SUBCUTANEOUS | Status: AC
  Administered 2023-07-24: 210 mg via SUBCUTANEOUS

## 2023-07-24 NOTE — Progress Notes (Signed)
 Patient is here for evenity injection #9. Patient received bilateral arm [place] evenity injections today. She tolerated injections well. She will return in 1 month for her next evenity injection.

## 2023-08-24 ENCOUNTER — Ambulatory Visit (INDEPENDENT_AMBULATORY_CARE_PROVIDER_SITE_OTHER): Admitting: Family Medicine

## 2023-08-24 DIAGNOSIS — M810 Age-related osteoporosis without current pathological fracture: Secondary | ICD-10-CM | POA: Diagnosis present

## 2023-08-24 MED ORDER — ROMOSOZUMAB-AQQG 105 MG/1.17ML ~~LOC~~ SOSY
210.0000 mg | PREFILLED_SYRINGE | Freq: Once | SUBCUTANEOUS | Status: AC
  Administered 2023-08-24: 210 mg via SUBCUTANEOUS

## 2023-08-24 NOTE — Progress Notes (Unsigned)
 Patient is here for evenity injection #10. Patient received bilateral arm [place] evenity injections today. She tolerated injections well. She will return in 1 month for her next evenity injection

## 2023-09-25 ENCOUNTER — Ambulatory Visit (INDEPENDENT_AMBULATORY_CARE_PROVIDER_SITE_OTHER): Admitting: Family Medicine

## 2023-09-25 DIAGNOSIS — M810 Age-related osteoporosis without current pathological fracture: Secondary | ICD-10-CM

## 2023-09-25 MED ORDER — ROMOSOZUMAB-AQQG 105 MG/1.17ML ~~LOC~~ SOSY
210.0000 mg | PREFILLED_SYRINGE | Freq: Once | SUBCUTANEOUS | Status: AC
  Administered 2023-09-25: 210 mg via SUBCUTANEOUS

## 2023-09-25 NOTE — Progress Notes (Signed)
 Patient is here for evenity  injection #11. Patient received bilateral lower abdomen [place] evenity  injections today. She tolerated injections well. She will return in 1 month for her next evenity  injection.

## 2023-10-26 ENCOUNTER — Ambulatory Visit: Admitting: Family Medicine

## 2023-10-26 DIAGNOSIS — M810 Age-related osteoporosis without current pathological fracture: Secondary | ICD-10-CM

## 2023-10-26 MED ORDER — ROMOSOZUMAB-AQQG 105 MG/1.17ML ~~LOC~~ SOSY
210.0000 mg | PREFILLED_SYRINGE | Freq: Once | SUBCUTANEOUS | Status: AC
  Administered 2023-10-26: 210 mg via SUBCUTANEOUS

## 2023-10-26 NOTE — Progress Notes (Unsigned)
 Patient is here for evenity  injection #12. Patient received bilateral lower abdomen [place] evenity  injections today. She tolerated injections well. She will return in 1 month to discuss next steps for therapy and go over BMD results.

## 2023-10-26 NOTE — Patient Instructions (Signed)
 Call and sched your bone density test [facility] Address: [address], [place], [place] [address] Phone: [phone]

## 2023-11-04 IMAGING — CT CT CERVICAL SPINE W/O CM
3 of 4 series · 12 of 33 positions shown, 14 images · non-contrast
Comparison: None.

CLINICAL DATA: Trauma



[Series 6: sagittal bone · sagittal · 0.34mm/px · 5 of 51 slices shown, 6 images]
[im 17/51  bone]
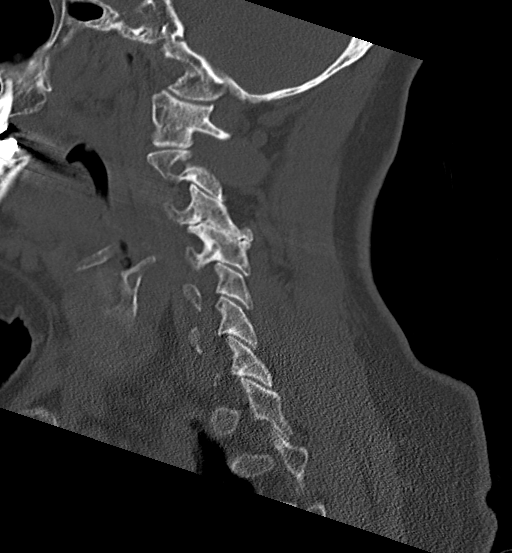
[im 21/51  bone]
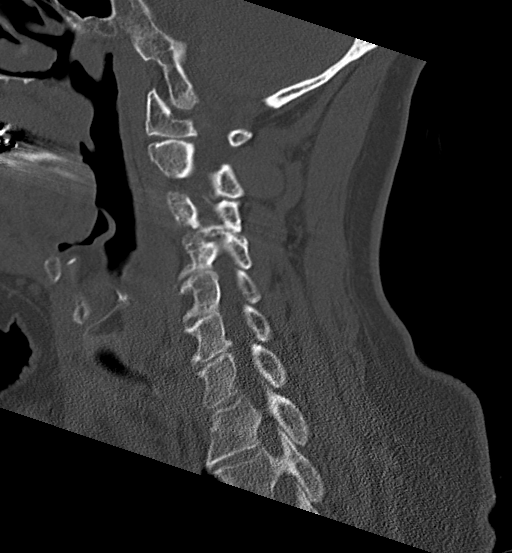
[im 26/51  soft-tissue]
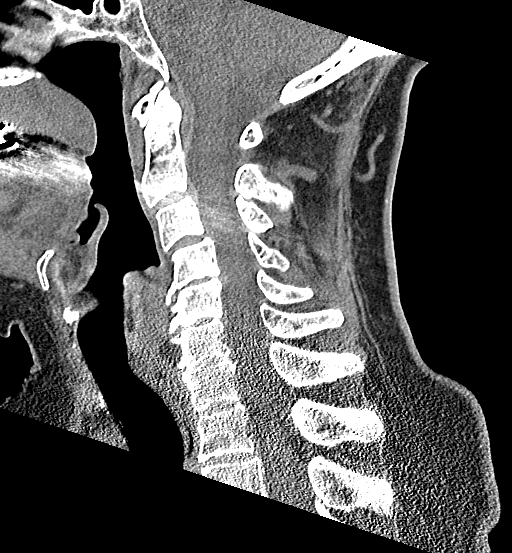
[im 26/51  bone]
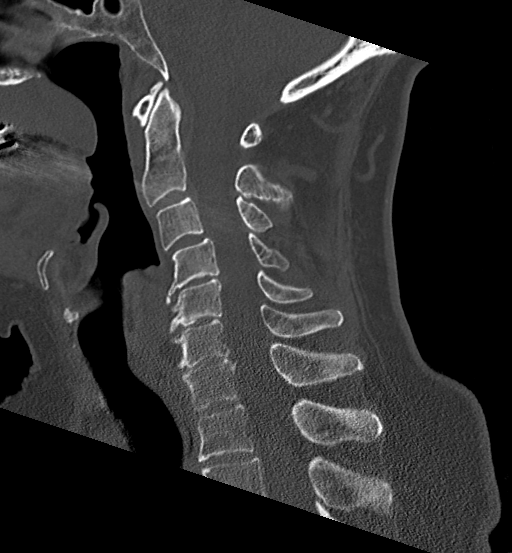
[im 30/51  bone]
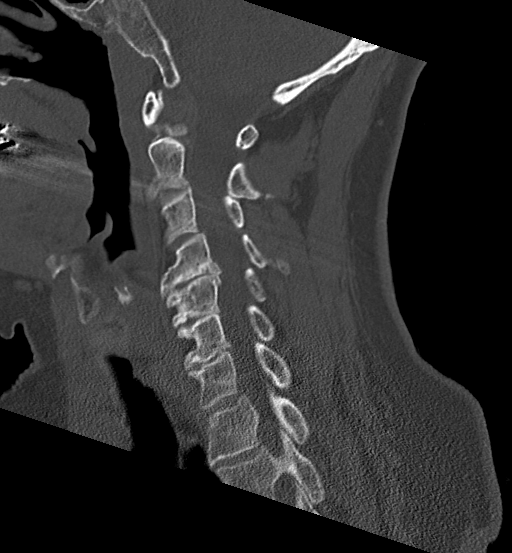
[im 34/51  bone]
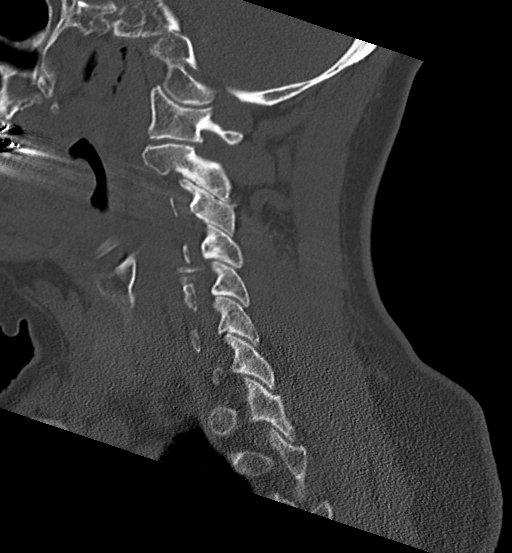

[Series 7: coronal bone · coronal · 0.33mm/px · 3 of 55 slices shown]
[im 11/55  bone]
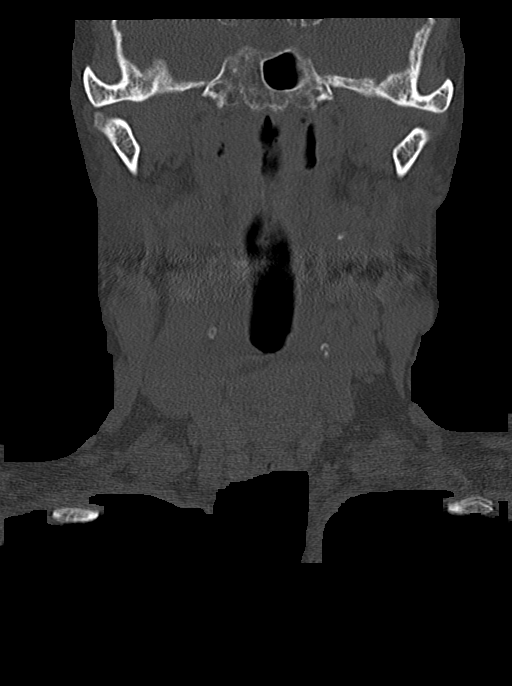
[im 22/55  bone]
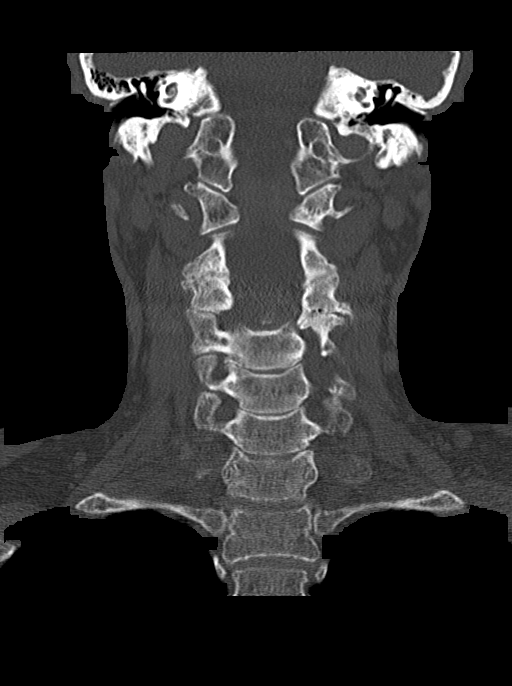
[im 33/55  bone]
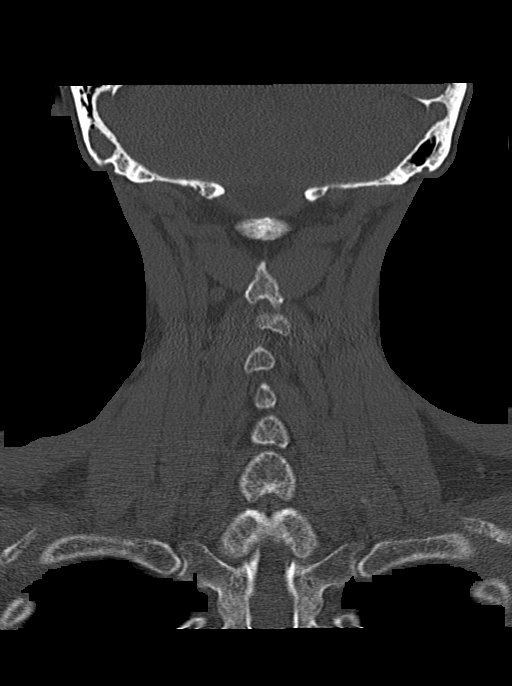

[Series 8: orthogonal bone · axial · 0.26mm/px · z∈[-764,-642]mm · 4 of 101 slices shown, 5 images]
[im 15/101  soft-tissue]
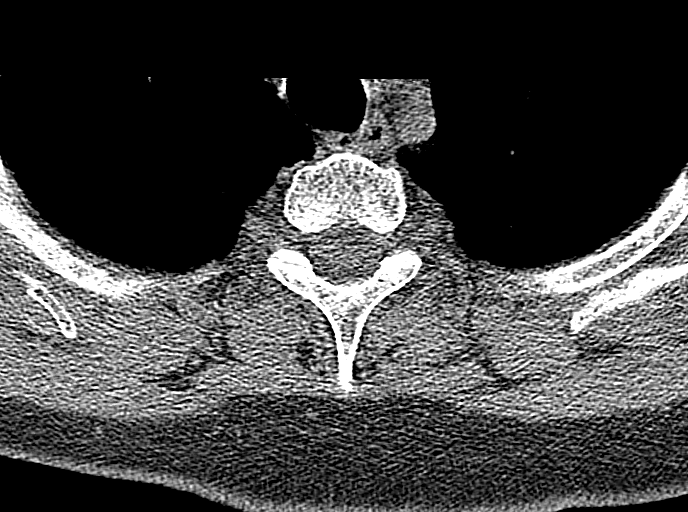
[im 15/101  bone]
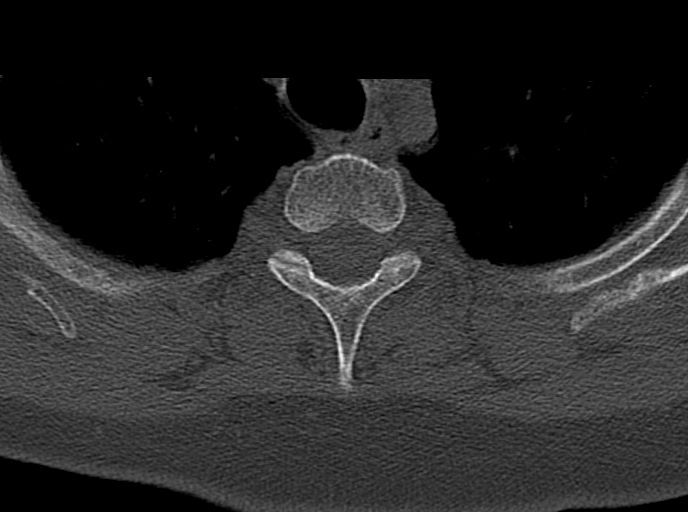
[im 43/101  bone]
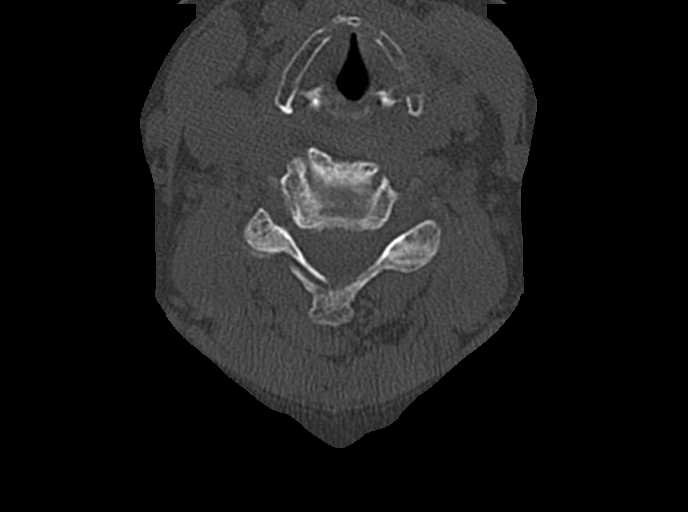
[im 58/101  bone]
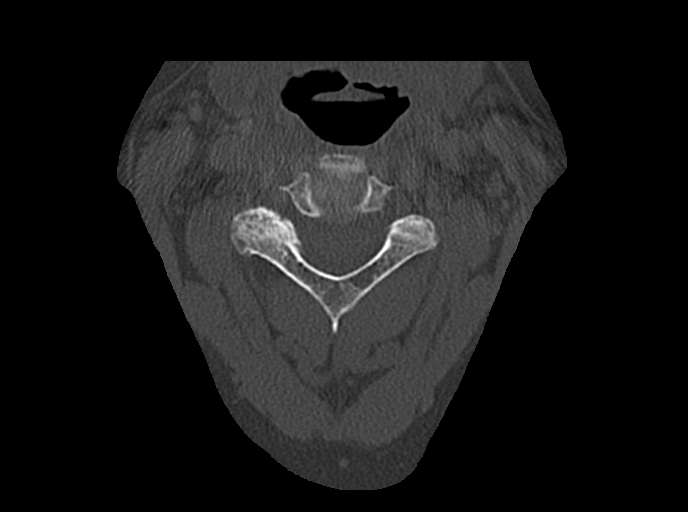
[im 86/101  bone]
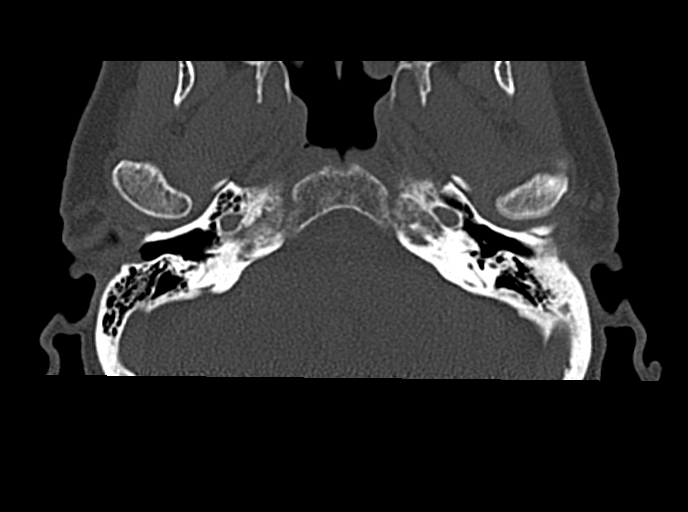

[12 of 33 positions shown; findings below may reference images not displayed]

FINDINGS: Alignment: There is minimal 2 mm anterolisthesis at C3-C4 level
which may be due to previous ligament injury and facet degeneration,
more so on the left side.

Skull base and vertebrae: No recent fracture is seen. Degenerative
changes are noted with disc space narrowing and bony spurs from
C4-C7 levels.

Soft tissues and spinal canal: There is no significant central
spinal stenosis.

Disc levels: There is mild to moderate encroachment of right neural
foramina at C2-C3 level. There is marked encroachment of left neural
foramen at C3-C4 level. There is mild to moderate encroachment of
neural foramina from C4-C7 levels.

Upper chest: Unremarkable.

Other: None.
IMPRESSION: No recent fracture is seen in the cervical spine. Cervical
spondylosis. Minimal anterolisthesis at C3-C4 level most likely is
residual from previous ligament injury and facet degeneration. There
is encroachment of neural foramina from C2-C7 levels, particularly
severe on the left side at C3-C4 level.

## 2023-11-16 ENCOUNTER — Ambulatory Visit (HOSPITAL_BASED_OUTPATIENT_CLINIC_OR_DEPARTMENT_OTHER)
Admission: RE | Admit: 2023-11-16 | Discharge: 2023-11-16 | Disposition: A | Discharge status: Home / Self Care | Source: Ambulatory Visit | Attending: Family Medicine | Admitting: Family Medicine

## 2023-11-16 DIAGNOSIS — M810 Age-related osteoporosis without current pathological fracture: Secondary | ICD-10-CM | POA: Insufficient documentation

## 2023-11-22 ENCOUNTER — Other Ambulatory Visit (HOSPITAL_BASED_OUTPATIENT_CLINIC_OR_DEPARTMENT_OTHER)

## 2023-11-23 ENCOUNTER — Ambulatory Visit: Payer: Self-pay | Admitting: Family Medicine

## 2023-11-29 ENCOUNTER — Ambulatory Visit (INDEPENDENT_AMBULATORY_CARE_PROVIDER_SITE_OTHER): Admitting: Family Medicine

## 2023-11-29 ENCOUNTER — Other Ambulatory Visit: Payer: Self-pay | Admitting: *Deleted

## 2023-11-29 VITALS — BP 134/67 | Ht 64.0 in | Wt 180.0 lb

## 2023-11-29 DIAGNOSIS — M810 Age-related osteoporosis without current pathological fracture: Secondary | ICD-10-CM

## 2023-11-29 MED ORDER — DENOSUMAB 60 MG/ML ~~LOC~~ SOSY: 60.0000 mg | PREFILLED_SYRINGE | Freq: Once | SUBCUTANEOUS | 0 refills | Status: AC

## 2023-11-29 NOTE — Patient Instructions (Signed)
 We will look into prolia  for you and let you know what the cost would be. Take the vitamin D  supplementation weekly until you finish this. I would recommend having those levels rechecked in 4-6 weeks.

## 2023-11-29 NOTE — Progress Notes (Signed)
 PCP: Rena Luke POUR, MD  Subjective:   HPI: Patient is a 68 y.o. female here for osteoporosis f/u.  Finished Evenity  course 10/26/23 DEXA with continued osteoporosis. 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     aspirin-acetaminophen-caffeine (EXCEDRIN  MIGRAINE) 250-250-65 MG tablet Take 1 tablet by mouth every 6 (six) hours as needed for headache. 30 tablet 0   atorvastatin (LIPITOR) 10 MG tablet Take 10 mg by mouth daily.     buPROPion  (WELLBUTRIN  SR) 100 MG 12 hr tablet Take 1 tablet (100 mg total) by mouth daily. 90 tablet 0   escitalopram  (LEXAPRO ) 20 MG tablet Take 1.5 tablets (30 mg total) by mouth daily. (Patient not taking: Reported on 12/18/2019) 135 tablet 0   ibuprofen (ADVIL,MOTRIN) 200 MG tablet Take 400 mg by mouth every 6 (six) hours as needed for moderate pain. (Patient not taking: Reported on 12/18/2019)     lamoTRIgine  (LAMICTAL ) 150 MG tablet Take 1 tablet (150 mg total) by mouth daily. (Patient not taking: Reported on 02/04/2020) 90 tablet 0   latanoprost (XALATAN) 0.005 % ophthalmic solution      mometasone-formoterol (DULERA) 100-5 MCG/ACT AERO Inhale 2 puffs into the lungs 2 (two) times daily.     venlafaxine XR (EFFEXOR-XR) 150 MG 24 hr capsule Take by mouth.     No current facility-administered medications on file prior to visit.    Past Surgical History:  Procedure Laterality Date   CESAREAN SECTION  1986   COLPOSCOPY  05/11    LGSIL--C&B-ECC NEG    FOOT SURGERY  2007   BILATERAL   NASAL SEPTUM SURGERY     POLYPECTOMY     ROTATOR CUFF REPAIR  2009   LEFT   TONSILLECTOMY AND ADENOIDECTOMY      Allergies  Allergen Reactions   Penicillins Rash    LMP 05/27/2010       No data to display              No data to display              Objective:  Physical Exam:  Gen: NAD, comfortable in exam room  DEXA 11/16/23  - R femoral neck T-score -2.8 (was -3.0 for proximal femur) - L femoral neck -2.3  - Spine -1.5 (from -1.3)   Assessment & Plan:  1. Osteoporosis - with history of wrist fracture.  Improvement noted in femur but not seen in spine or forearm.  She no longer has a T score in severe osteoporotic range however.  Discussed options - will look into prolia .  Her vitamin D  was low recently and she's doing weekly 50,000 international units for 8 weeks.    Total visit time 15 minutes including documentation.

## 2023-12-08 ENCOUNTER — Other Ambulatory Visit: Payer: Self-pay

## 2023-12-08 ENCOUNTER — Other Ambulatory Visit: Payer: Self-pay | Admitting: Pharmacy Technician

## 2023-12-08 ENCOUNTER — Other Ambulatory Visit: Payer: Self-pay | Admitting: *Deleted

## 2023-12-08 ENCOUNTER — Other Ambulatory Visit (HOSPITAL_COMMUNITY): Payer: Self-pay

## 2023-12-08 MED ORDER — DENOSUMAB 60 MG/ML ~~LOC~~ SOSY
60.0000 mg | PREFILLED_SYRINGE | Freq: Once | SUBCUTANEOUS | 0 refills | Status: AC
  Filled 2023-12-08: qty 1, 1d supply, fill #0
  Filled 2023-12-14: qty 1, 180d supply, fill #0

## 2023-12-08 NOTE — Progress Notes (Signed)
 Pharmacy Patient Advocate Encounter  Insurance verification completed.   The patient is insured through [employer] test claim for Prolia . Co-pay is $195.  This test claim was processed through [facility]- copay amounts may vary at other pharmacies due to pharmacy/plan contracts, or as the patient moves through the different stages of their insurance plan.

## 2023-12-08 NOTE — Progress Notes (Signed)
 Ran test claim. Copay is $195

## 2023-12-12 ENCOUNTER — Other Ambulatory Visit: Payer: Self-pay

## 2023-12-13 ENCOUNTER — Other Ambulatory Visit: Payer: Self-pay

## 2023-12-14 ENCOUNTER — Other Ambulatory Visit: Payer: Self-pay

## 2023-12-14 NOTE — Progress Notes (Signed)
 Specialty Pharmacy Initial Fill Coordination Note  Lindsey Klein is a 68 y.o. female contacted today regarding initial fill of specialty medication(s) Denosumab  (PROLIA )   Patient requested Courier to Provider Office   Delivery date: 12/18/23   Verified [facility]   Medication will be filled on 9/19.   Patient is aware of $195 copayment.

## 2023-12-15 ENCOUNTER — Other Ambulatory Visit: Payer: Self-pay

## 2023-12-19 ENCOUNTER — Encounter: Payer: Self-pay | Admitting: *Deleted

## 2023-12-19 ENCOUNTER — Other Ambulatory Visit: Payer: Self-pay | Admitting: *Deleted

## 2023-12-19 DIAGNOSIS — M810 Age-related osteoporosis without current pathological fracture: Secondary | ICD-10-CM

## 2023-12-21 ENCOUNTER — Ambulatory Visit: Payer: Self-pay | Admitting: Family Medicine

## 2023-12-21 LAB — BASIC METABOLIC PANEL WITH GFR
BUN/Creatinine Ratio: 13 (ref 12–28)
BUN: 12 mg/dL (ref 8–27)
CO2: 23 mmol/L (ref 20–29)
Calcium: 10.3 mg/dL (ref 8.7–10.3)
Chloride: 103 mmol/L (ref 96–106)
Creatinine, Ser: 0.91 mg/dL (ref 0.57–1.00)
Glucose: 83 mg/dL (ref 70–99)
Potassium: 4.4 mmol/L (ref 3.5–5.2)
Sodium: 139 mmol/L (ref 134–144)
eGFR: 69 mL/min/1.73 (ref >59)

## 2023-12-21 LAB — VITAMIN D 25 HYDROXY (VIT D DEFICIENCY, FRACTURES): Vit D, 25-Hydroxy: 27.1 ng/mL — ABNORMAL LOW (ref 30.0–100.0)

## 2023-12-26 ENCOUNTER — Ambulatory Visit: Admitting: Family Medicine

## 2023-12-26 DIAGNOSIS — M810 Age-related osteoporosis without current pathological fracture: Secondary | ICD-10-CM | POA: Diagnosis present

## 2023-12-26 MED ORDER — DENOSUMAB 60 MG/ML ~~LOC~~ SOSY
60.0000 mg | PREFILLED_SYRINGE | Freq: Once | SUBCUTANEOUS | Status: AC
  Administered 2023-12-26: 60 mg via SUBCUTANEOUS

## 2023-12-26 NOTE — Progress Notes (Signed)
 Patient given prolia  Lindsey Klein  injection 60mg /ml in the left arm. Patient tolerated injection well without reaction at the injection site. Patient supplied the medication and  will schedule next injection, which is 6 months from today Pt is taking 50,000 once weekly per Dr Rena and will take 1000mg  afterwards. Pt given lab order to do before next injection.

## 2024-01-03 ENCOUNTER — Other Ambulatory Visit

## 2024-06-25 ENCOUNTER — Ambulatory Visit: Admitting: Family Medicine
